# Patient Record
Sex: Female | Born: 1945 | Race: Black or African American | Hispanic: No | Marital: Single | Smoking: Never smoker
Health system: Southern US, Community
[De-identification: ages and names within clinical notes are randomized; demographics above are authoritative.]

## PROBLEM LIST (undated history)

## (undated) DIAGNOSIS — E119 Type 2 diabetes mellitus without complications: Secondary | ICD-10-CM

## (undated) DIAGNOSIS — I10 Essential (primary) hypertension: Secondary | ICD-10-CM

## (undated) HISTORY — PX: ORIF ANKLE FRACTURE: SUR919

---

## 2002-06-27 ENCOUNTER — Ambulatory Visit (HOSPITAL_COMMUNITY): Admission: RE | Admit: 2002-06-27 | Discharge: 2002-06-27 | Payer: Self-pay | Admitting: Family Medicine

## 2002-06-27 ENCOUNTER — Encounter: Payer: Self-pay | Admitting: Family Medicine

## 2002-07-06 ENCOUNTER — Ambulatory Visit (HOSPITAL_COMMUNITY): Admission: RE | Admit: 2002-07-06 | Discharge: 2002-07-06 | Payer: Self-pay | Admitting: Family Medicine

## 2002-07-06 ENCOUNTER — Encounter: Payer: Self-pay | Admitting: Family Medicine

## 2003-06-26 ENCOUNTER — Ambulatory Visit (HOSPITAL_COMMUNITY): Admission: RE | Admit: 2003-06-26 | Discharge: 2003-06-26 | Payer: Self-pay | Admitting: Family Medicine

## 2004-09-09 ENCOUNTER — Ambulatory Visit (HOSPITAL_COMMUNITY): Admission: RE | Admit: 2004-09-09 | Discharge: 2004-09-09 | Payer: Self-pay | Admitting: Family Medicine

## 2006-06-20 ENCOUNTER — Ambulatory Visit (HOSPITAL_COMMUNITY): Admission: RE | Admit: 2006-06-20 | Discharge: 2006-06-20 | Payer: Self-pay | Admitting: Family Medicine

## 2006-07-06 ENCOUNTER — Ambulatory Visit (HOSPITAL_COMMUNITY): Admission: RE | Admit: 2006-07-06 | Discharge: 2006-07-06 | Payer: Self-pay | Admitting: Family Medicine

## 2007-06-27 ENCOUNTER — Ambulatory Visit (HOSPITAL_COMMUNITY): Admission: RE | Admit: 2007-06-27 | Discharge: 2007-06-27 | Payer: Self-pay | Admitting: Family Medicine

## 2008-06-27 ENCOUNTER — Ambulatory Visit (HOSPITAL_COMMUNITY): Admission: RE | Admit: 2008-06-27 | Discharge: 2008-06-27 | Payer: Self-pay | Admitting: Family Medicine

## 2009-06-30 ENCOUNTER — Ambulatory Visit (HOSPITAL_COMMUNITY): Admission: RE | Admit: 2009-06-30 | Discharge: 2009-06-30 | Payer: Self-pay | Admitting: Family Medicine

## 2010-07-14 ENCOUNTER — Ambulatory Visit (HOSPITAL_COMMUNITY): Admission: RE | Admit: 2010-07-14 | Discharge: 2010-07-14 | Payer: Self-pay | Admitting: Internal Medicine

## 2011-07-15 ENCOUNTER — Other Ambulatory Visit (HOSPITAL_COMMUNITY): Payer: Self-pay | Admitting: Internal Medicine

## 2011-07-15 DIAGNOSIS — Z139 Encounter for screening, unspecified: Secondary | ICD-10-CM

## 2011-07-22 ENCOUNTER — Ambulatory Visit (HOSPITAL_COMMUNITY)
Admission: RE | Admit: 2011-07-22 | Discharge: 2011-07-22 | Disposition: A | Discharge status: Home / Self Care | Payer: Medicare Other | Source: Ambulatory Visit | Attending: Internal Medicine | Admitting: Internal Medicine

## 2011-07-22 DIAGNOSIS — Z139 Encounter for screening, unspecified: Secondary | ICD-10-CM

## 2011-07-22 DIAGNOSIS — Z1231 Encounter for screening mammogram for malignant neoplasm of breast: Secondary | ICD-10-CM | POA: Insufficient documentation

## 2011-07-29 ENCOUNTER — Other Ambulatory Visit: Payer: Self-pay | Admitting: Internal Medicine

## 2011-07-29 DIAGNOSIS — R928 Other abnormal and inconclusive findings on diagnostic imaging of breast: Secondary | ICD-10-CM

## 2011-08-18 ENCOUNTER — Ambulatory Visit (HOSPITAL_COMMUNITY): Payer: Medicare Other

## 2011-09-16 DIAGNOSIS — E782 Mixed hyperlipidemia: Secondary | ICD-10-CM | POA: Diagnosis not present

## 2011-09-16 DIAGNOSIS — I10 Essential (primary) hypertension: Secondary | ICD-10-CM | POA: Diagnosis not present

## 2011-09-16 DIAGNOSIS — E119 Type 2 diabetes mellitus without complications: Secondary | ICD-10-CM | POA: Diagnosis not present

## 2011-09-22 ENCOUNTER — Ambulatory Visit (HOSPITAL_COMMUNITY)
Admission: RE | Admit: 2011-09-22 | Discharge: 2011-09-22 | Disposition: A | Discharge status: Home / Self Care | Payer: Medicare Other | Source: Ambulatory Visit | Attending: Internal Medicine | Admitting: Internal Medicine

## 2011-09-22 ENCOUNTER — Other Ambulatory Visit: Payer: Self-pay | Admitting: Internal Medicine

## 2011-09-22 DIAGNOSIS — N6009 Solitary cyst of unspecified breast: Secondary | ICD-10-CM | POA: Diagnosis not present

## 2011-09-22 DIAGNOSIS — N63 Unspecified lump in unspecified breast: Secondary | ICD-10-CM | POA: Diagnosis not present

## 2011-09-22 DIAGNOSIS — R928 Other abnormal and inconclusive findings on diagnostic imaging of breast: Secondary | ICD-10-CM | POA: Insufficient documentation

## 2011-12-17 DIAGNOSIS — E1149 Type 2 diabetes mellitus with other diabetic neurological complication: Secondary | ICD-10-CM | POA: Diagnosis not present

## 2012-03-17 DIAGNOSIS — I10 Essential (primary) hypertension: Secondary | ICD-10-CM | POA: Diagnosis not present

## 2012-03-17 DIAGNOSIS — E782 Mixed hyperlipidemia: Secondary | ICD-10-CM | POA: Diagnosis not present

## 2012-03-17 DIAGNOSIS — E119 Type 2 diabetes mellitus without complications: Secondary | ICD-10-CM | POA: Diagnosis not present

## 2012-06-19 DIAGNOSIS — M549 Dorsalgia, unspecified: Secondary | ICD-10-CM | POA: Diagnosis not present

## 2012-08-15 ENCOUNTER — Other Ambulatory Visit (HOSPITAL_COMMUNITY): Payer: Self-pay | Admitting: Internal Medicine

## 2012-08-15 DIAGNOSIS — Z139 Encounter for screening, unspecified: Secondary | ICD-10-CM

## 2012-09-19 DIAGNOSIS — E119 Type 2 diabetes mellitus without complications: Secondary | ICD-10-CM | POA: Diagnosis not present

## 2012-09-19 DIAGNOSIS — I10 Essential (primary) hypertension: Secondary | ICD-10-CM | POA: Diagnosis not present

## 2012-09-19 DIAGNOSIS — E782 Mixed hyperlipidemia: Secondary | ICD-10-CM | POA: Diagnosis not present

## 2012-09-25 ENCOUNTER — Ambulatory Visit (HOSPITAL_COMMUNITY)
Admission: RE | Admit: 2012-09-25 | Discharge: 2012-09-25 | Disposition: A | Discharge status: Home / Self Care | Payer: Medicare Other | Source: Ambulatory Visit | Attending: Internal Medicine | Admitting: Internal Medicine

## 2012-09-25 DIAGNOSIS — Z139 Encounter for screening, unspecified: Secondary | ICD-10-CM

## 2012-09-25 DIAGNOSIS — Z1231 Encounter for screening mammogram for malignant neoplasm of breast: Secondary | ICD-10-CM | POA: Diagnosis not present

## 2012-12-14 DIAGNOSIS — J301 Allergic rhinitis due to pollen: Secondary | ICD-10-CM | POA: Diagnosis not present

## 2013-03-22 DIAGNOSIS — E119 Type 2 diabetes mellitus without complications: Secondary | ICD-10-CM | POA: Diagnosis not present

## 2013-03-22 DIAGNOSIS — I10 Essential (primary) hypertension: Secondary | ICD-10-CM | POA: Diagnosis not present

## 2013-06-22 DIAGNOSIS — I10 Essential (primary) hypertension: Secondary | ICD-10-CM | POA: Diagnosis not present

## 2013-09-17 ENCOUNTER — Other Ambulatory Visit (HOSPITAL_COMMUNITY): Payer: Self-pay | Admitting: Internal Medicine

## 2013-09-17 DIAGNOSIS — Z139 Encounter for screening, unspecified: Secondary | ICD-10-CM

## 2013-09-24 DIAGNOSIS — Z0000 Encounter for general adult medical examination without abnormal findings: Secondary | ICD-10-CM | POA: Diagnosis not present

## 2013-09-24 DIAGNOSIS — I10 Essential (primary) hypertension: Secondary | ICD-10-CM | POA: Diagnosis not present

## 2013-09-24 DIAGNOSIS — E119 Type 2 diabetes mellitus without complications: Secondary | ICD-10-CM | POA: Diagnosis not present

## 2013-09-27 ENCOUNTER — Ambulatory Visit (HOSPITAL_COMMUNITY)
Admission: RE | Admit: 2013-09-27 | Discharge: 2013-09-27 | Disposition: A | Discharge status: Home / Self Care | Payer: Medicare Other | Source: Ambulatory Visit | Attending: Internal Medicine | Admitting: Internal Medicine

## 2013-09-27 DIAGNOSIS — Z1231 Encounter for screening mammogram for malignant neoplasm of breast: Secondary | ICD-10-CM | POA: Insufficient documentation

## 2013-09-27 DIAGNOSIS — Z139 Encounter for screening, unspecified: Secondary | ICD-10-CM

## 2013-10-01 ENCOUNTER — Other Ambulatory Visit: Payer: Self-pay | Admitting: Internal Medicine

## 2013-10-03 ENCOUNTER — Other Ambulatory Visit (HOSPITAL_COMMUNITY): Payer: Self-pay | Admitting: Internal Medicine

## 2013-10-03 DIAGNOSIS — N63 Unspecified lump in unspecified breast: Secondary | ICD-10-CM

## 2013-10-10 ENCOUNTER — Other Ambulatory Visit (HOSPITAL_COMMUNITY): Payer: Self-pay | Admitting: Internal Medicine

## 2013-10-10 ENCOUNTER — Other Ambulatory Visit (HOSPITAL_COMMUNITY): Payer: Medicare Other

## 2013-10-10 DIAGNOSIS — N63 Unspecified lump in unspecified breast: Secondary | ICD-10-CM

## 2013-10-17 ENCOUNTER — Ambulatory Visit (HOSPITAL_COMMUNITY)
Admission: RE | Admit: 2013-10-17 | Discharge: 2013-10-17 | Disposition: A | Discharge status: Home / Self Care | Payer: Medicare Other | Source: Ambulatory Visit | Attending: Internal Medicine | Admitting: Internal Medicine

## 2013-10-17 ENCOUNTER — Ambulatory Visit (HOSPITAL_COMMUNITY): Payer: Medicare Other

## 2013-10-17 ENCOUNTER — Other Ambulatory Visit (HOSPITAL_COMMUNITY): Payer: Self-pay | Admitting: Internal Medicine

## 2013-10-17 DIAGNOSIS — N63 Unspecified lump in unspecified breast: Secondary | ICD-10-CM

## 2013-10-17 DIAGNOSIS — R928 Other abnormal and inconclusive findings on diagnostic imaging of breast: Secondary | ICD-10-CM | POA: Diagnosis not present

## 2013-10-17 DIAGNOSIS — N6009 Solitary cyst of unspecified breast: Secondary | ICD-10-CM | POA: Diagnosis not present

## 2013-10-17 DIAGNOSIS — R922 Inconclusive mammogram: Secondary | ICD-10-CM | POA: Diagnosis not present

## 2013-12-27 DIAGNOSIS — I10 Essential (primary) hypertension: Secondary | ICD-10-CM | POA: Diagnosis not present

## 2013-12-27 DIAGNOSIS — IMO0001 Reserved for inherently not codable concepts without codable children: Secondary | ICD-10-CM | POA: Diagnosis not present

## 2014-03-28 DIAGNOSIS — I10 Essential (primary) hypertension: Secondary | ICD-10-CM | POA: Diagnosis not present

## 2014-03-28 DIAGNOSIS — IMO0001 Reserved for inherently not codable concepts without codable children: Secondary | ICD-10-CM | POA: Diagnosis not present

## 2014-04-08 DIAGNOSIS — Z1211 Encounter for screening for malignant neoplasm of colon: Secondary | ICD-10-CM | POA: Diagnosis not present

## 2014-06-27 DIAGNOSIS — E1065 Type 1 diabetes mellitus with hyperglycemia: Secondary | ICD-10-CM | POA: Diagnosis not present

## 2014-06-27 DIAGNOSIS — I10 Essential (primary) hypertension: Secondary | ICD-10-CM | POA: Diagnosis not present

## 2014-07-23 DIAGNOSIS — E119 Type 2 diabetes mellitus without complications: Secondary | ICD-10-CM | POA: Diagnosis not present

## 2014-09-23 ENCOUNTER — Other Ambulatory Visit (HOSPITAL_COMMUNITY): Payer: Self-pay | Admitting: Internal Medicine

## 2014-09-23 DIAGNOSIS — Z1231 Encounter for screening mammogram for malignant neoplasm of breast: Secondary | ICD-10-CM

## 2014-10-01 DIAGNOSIS — Z0000 Encounter for general adult medical examination without abnormal findings: Secondary | ICD-10-CM | POA: Diagnosis not present

## 2014-10-01 DIAGNOSIS — I10 Essential (primary) hypertension: Secondary | ICD-10-CM | POA: Diagnosis not present

## 2014-10-01 DIAGNOSIS — Z1389 Encounter for screening for other disorder: Secondary | ICD-10-CM | POA: Diagnosis not present

## 2014-10-21 ENCOUNTER — Ambulatory Visit (HOSPITAL_COMMUNITY)
Admission: RE | Admit: 2014-10-21 | Discharge: 2014-10-21 | Disposition: A | Discharge status: Home / Self Care | Payer: Medicare Other | Source: Ambulatory Visit | Attending: Internal Medicine | Admitting: Internal Medicine

## 2014-10-21 DIAGNOSIS — Z1231 Encounter for screening mammogram for malignant neoplasm of breast: Secondary | ICD-10-CM | POA: Insufficient documentation

## 2015-01-02 DIAGNOSIS — I10 Essential (primary) hypertension: Secondary | ICD-10-CM | POA: Diagnosis not present

## 2015-01-02 DIAGNOSIS — Z79899 Other long term (current) drug therapy: Secondary | ICD-10-CM | POA: Diagnosis not present

## 2015-01-02 DIAGNOSIS — E119 Type 2 diabetes mellitus without complications: Secondary | ICD-10-CM | POA: Diagnosis not present

## 2015-01-02 DIAGNOSIS — E780 Pure hypercholesterolemia: Secondary | ICD-10-CM | POA: Diagnosis not present

## 2015-01-02 DIAGNOSIS — Z1211 Encounter for screening for malignant neoplasm of colon: Secondary | ICD-10-CM | POA: Diagnosis not present

## 2015-01-02 DIAGNOSIS — E785 Hyperlipidemia, unspecified: Secondary | ICD-10-CM | POA: Diagnosis not present

## 2015-01-08 DIAGNOSIS — E1165 Type 2 diabetes mellitus with hyperglycemia: Secondary | ICD-10-CM | POA: Diagnosis not present

## 2015-01-08 DIAGNOSIS — I10 Essential (primary) hypertension: Secondary | ICD-10-CM | POA: Diagnosis not present

## 2015-01-08 DIAGNOSIS — J309 Allergic rhinitis, unspecified: Secondary | ICD-10-CM | POA: Diagnosis not present

## 2015-04-10 DIAGNOSIS — E1165 Type 2 diabetes mellitus with hyperglycemia: Secondary | ICD-10-CM | POA: Diagnosis not present

## 2015-04-10 DIAGNOSIS — I10 Essential (primary) hypertension: Secondary | ICD-10-CM | POA: Diagnosis not present

## 2015-07-14 DIAGNOSIS — E1122 Type 2 diabetes mellitus with diabetic chronic kidney disease: Secondary | ICD-10-CM | POA: Diagnosis not present

## 2015-07-14 DIAGNOSIS — I10 Essential (primary) hypertension: Secondary | ICD-10-CM | POA: Diagnosis not present

## 2015-07-14 DIAGNOSIS — E1165 Type 2 diabetes mellitus with hyperglycemia: Secondary | ICD-10-CM | POA: Diagnosis not present

## 2015-10-01 ENCOUNTER — Other Ambulatory Visit (HOSPITAL_COMMUNITY): Payer: Self-pay | Admitting: Internal Medicine

## 2015-10-01 DIAGNOSIS — Z1231 Encounter for screening mammogram for malignant neoplasm of breast: Secondary | ICD-10-CM

## 2015-10-16 DIAGNOSIS — Z0000 Encounter for general adult medical examination without abnormal findings: Secondary | ICD-10-CM | POA: Diagnosis not present

## 2015-10-16 DIAGNOSIS — E1121 Type 2 diabetes mellitus with diabetic nephropathy: Secondary | ICD-10-CM | POA: Diagnosis not present

## 2015-10-16 DIAGNOSIS — I10 Essential (primary) hypertension: Secondary | ICD-10-CM | POA: Diagnosis not present

## 2015-10-16 DIAGNOSIS — Z1389 Encounter for screening for other disorder: Secondary | ICD-10-CM | POA: Diagnosis not present

## 2015-10-23 ENCOUNTER — Ambulatory Visit (HOSPITAL_COMMUNITY): Payer: Medicare Other

## 2015-10-27 ENCOUNTER — Ambulatory Visit (HOSPITAL_COMMUNITY)
Admission: RE | Admit: 2015-10-27 | Discharge: 2015-10-27 | Disposition: A | Discharge status: Home / Self Care | Payer: Medicare Other | Source: Ambulatory Visit | Attending: Internal Medicine | Admitting: Internal Medicine

## 2015-10-27 DIAGNOSIS — Z1231 Encounter for screening mammogram for malignant neoplasm of breast: Secondary | ICD-10-CM | POA: Insufficient documentation

## 2015-10-27 DIAGNOSIS — R928 Other abnormal and inconclusive findings on diagnostic imaging of breast: Secondary | ICD-10-CM | POA: Insufficient documentation

## 2015-10-30 ENCOUNTER — Other Ambulatory Visit: Payer: Self-pay | Admitting: Nurse Practitioner

## 2015-10-30 ENCOUNTER — Other Ambulatory Visit: Payer: Self-pay | Admitting: Internal Medicine

## 2015-10-30 DIAGNOSIS — R928 Other abnormal and inconclusive findings on diagnostic imaging of breast: Secondary | ICD-10-CM

## 2015-11-05 ENCOUNTER — Other Ambulatory Visit: Payer: Self-pay | Admitting: Internal Medicine

## 2015-11-05 DIAGNOSIS — R928 Other abnormal and inconclusive findings on diagnostic imaging of breast: Secondary | ICD-10-CM

## 2015-11-11 ENCOUNTER — Ambulatory Visit (HOSPITAL_COMMUNITY)
Admission: RE | Admit: 2015-11-11 | Discharge: 2015-11-11 | Disposition: A | Discharge status: Home / Self Care | Payer: Medicare Other | Source: Ambulatory Visit | Attending: Internal Medicine | Admitting: Internal Medicine

## 2015-11-11 DIAGNOSIS — R928 Other abnormal and inconclusive findings on diagnostic imaging of breast: Secondary | ICD-10-CM | POA: Diagnosis not present

## 2015-11-11 DIAGNOSIS — N63 Unspecified lump in breast: Secondary | ICD-10-CM | POA: Diagnosis not present

## 2015-11-11 DIAGNOSIS — N6001 Solitary cyst of right breast: Secondary | ICD-10-CM | POA: Insufficient documentation

## 2015-11-18 ENCOUNTER — Encounter (HOSPITAL_COMMUNITY): Payer: Medicare Other

## 2016-01-05 DIAGNOSIS — E11319 Type 2 diabetes mellitus with unspecified diabetic retinopathy without macular edema: Secondary | ICD-10-CM | POA: Diagnosis not present

## 2016-01-15 DIAGNOSIS — E1121 Type 2 diabetes mellitus with diabetic nephropathy: Secondary | ICD-10-CM | POA: Diagnosis not present

## 2016-01-15 DIAGNOSIS — I10 Essential (primary) hypertension: Secondary | ICD-10-CM | POA: Diagnosis not present

## 2016-04-23 DIAGNOSIS — E784 Other hyperlipidemia: Secondary | ICD-10-CM | POA: Diagnosis not present

## 2016-04-23 DIAGNOSIS — E1121 Type 2 diabetes mellitus with diabetic nephropathy: Secondary | ICD-10-CM | POA: Diagnosis not present

## 2016-04-23 DIAGNOSIS — I10 Essential (primary) hypertension: Secondary | ICD-10-CM | POA: Diagnosis not present

## 2016-07-29 DIAGNOSIS — E784 Other hyperlipidemia: Secondary | ICD-10-CM | POA: Diagnosis not present

## 2016-07-29 DIAGNOSIS — E1121 Type 2 diabetes mellitus with diabetic nephropathy: Secondary | ICD-10-CM | POA: Diagnosis not present

## 2016-07-29 DIAGNOSIS — I10 Essential (primary) hypertension: Secondary | ICD-10-CM | POA: Diagnosis not present

## 2016-10-25 ENCOUNTER — Other Ambulatory Visit (HOSPITAL_COMMUNITY): Payer: Self-pay | Admitting: Internal Medicine

## 2016-10-25 DIAGNOSIS — Z1231 Encounter for screening mammogram for malignant neoplasm of breast: Secondary | ICD-10-CM

## 2016-11-01 DIAGNOSIS — Z0000 Encounter for general adult medical examination without abnormal findings: Secondary | ICD-10-CM | POA: Diagnosis not present

## 2016-11-01 DIAGNOSIS — I10 Essential (primary) hypertension: Secondary | ICD-10-CM | POA: Diagnosis not present

## 2016-11-01 DIAGNOSIS — E784 Other hyperlipidemia: Secondary | ICD-10-CM | POA: Diagnosis not present

## 2016-11-01 DIAGNOSIS — E1121 Type 2 diabetes mellitus with diabetic nephropathy: Secondary | ICD-10-CM | POA: Diagnosis not present

## 2016-11-01 DIAGNOSIS — Z1389 Encounter for screening for other disorder: Secondary | ICD-10-CM | POA: Diagnosis not present

## 2016-11-11 ENCOUNTER — Ambulatory Visit (HOSPITAL_COMMUNITY)
Admission: RE | Admit: 2016-11-11 | Discharge: 2016-11-11 | Disposition: A | Discharge status: Home / Self Care | Payer: Medicare Other | Source: Ambulatory Visit | Attending: Internal Medicine | Admitting: Internal Medicine

## 2016-11-11 DIAGNOSIS — Z1231 Encounter for screening mammogram for malignant neoplasm of breast: Secondary | ICD-10-CM | POA: Insufficient documentation

## 2016-11-15 DIAGNOSIS — Z23 Encounter for immunization: Secondary | ICD-10-CM | POA: Diagnosis not present

## 2017-01-31 DIAGNOSIS — I10 Essential (primary) hypertension: Secondary | ICD-10-CM | POA: Diagnosis not present

## 2017-01-31 DIAGNOSIS — E784 Other hyperlipidemia: Secondary | ICD-10-CM | POA: Diagnosis not present

## 2017-01-31 DIAGNOSIS — E1121 Type 2 diabetes mellitus with diabetic nephropathy: Secondary | ICD-10-CM | POA: Diagnosis not present

## 2017-03-14 DIAGNOSIS — E11319 Type 2 diabetes mellitus with unspecified diabetic retinopathy without macular edema: Secondary | ICD-10-CM | POA: Diagnosis not present

## 2017-05-09 DIAGNOSIS — E784 Other hyperlipidemia: Secondary | ICD-10-CM | POA: Diagnosis not present

## 2017-05-09 DIAGNOSIS — E1121 Type 2 diabetes mellitus with diabetic nephropathy: Secondary | ICD-10-CM | POA: Diagnosis not present

## 2017-05-09 DIAGNOSIS — I10 Essential (primary) hypertension: Secondary | ICD-10-CM | POA: Diagnosis not present

## 2017-07-03 ENCOUNTER — Emergency Department (HOSPITAL_COMMUNITY)
Admission: EM | Admit: 2017-07-03 | Discharge: 2017-07-03 | Disposition: A | Discharge status: Home / Self Care | Payer: Medicare Other | Attending: Emergency Medicine | Admitting: Emergency Medicine

## 2017-07-03 ENCOUNTER — Encounter (HOSPITAL_COMMUNITY): Payer: Self-pay | Admitting: Emergency Medicine

## 2017-07-03 DIAGNOSIS — E119 Type 2 diabetes mellitus without complications: Secondary | ICD-10-CM | POA: Insufficient documentation

## 2017-07-03 DIAGNOSIS — R410 Disorientation, unspecified: Secondary | ICD-10-CM | POA: Diagnosis not present

## 2017-07-03 DIAGNOSIS — R4182 Altered mental status, unspecified: Secondary | ICD-10-CM | POA: Diagnosis present

## 2017-07-03 DIAGNOSIS — I10 Essential (primary) hypertension: Secondary | ICD-10-CM | POA: Diagnosis not present

## 2017-07-03 HISTORY — DX: Type 2 diabetes mellitus without complications: E11.9

## 2017-07-03 HISTORY — DX: Essential (primary) hypertension: I10

## 2017-07-03 LAB — CBG MONITORING, ED: GLUCOSE-CAPILLARY: 101 mg/dL — AB (ref 65–99)

## 2017-07-03 NOTE — ED Provider Notes (Signed)
[facility] Provider Note   CSN: [id number] Arrival date & time: 07/03/17  [id number]     History   Chief Complaint Chief Complaint  Patient presents with  . Altered Mental Status    HPI Jennifer Short is a 71 y.o. female.  HPI Patient is a 71 year old female who awoke at around 6 AM and had difficulty remembering her daughter's phone number.  This was transient and lasted about 10 or 15 minutes.  The patient's husband reports no difficulty with her speech or slurred speech.  No word finding difficulty either just could not remember the phone number.  Patient denies weakness or numbness of her arms or legs.  No facial asymmetry noted by the husband.  No headaches.  No urinary symptoms.  No chest pain or shortness of breath.  Asymptomatic at this time.  The husband wanted her to come to the ER to be evaluated.  No prior history of stroke.  She does have a history of diabetes and hypertension.  She is completely asymptomatic at this time.     Past Medical History:  Diagnosis Date  . Diabetes mellitus without complication (HCC)   . Hypertension     There are no active problems to display for this patient.   History reviewed. No pertinent surgical history.  OB History    Gravida Para Term Preterm AB Living   4 4 2 2   4    SAB TAB Ectopic Multiple Live Births                   Home Medications    Prior to Admission medications   Not on File    Family History Family History  Problem Relation Age of Onset  . Diabetes Father     Social History Social History  Substance Use Topics  . Smoking status: Never Smoker  . Smokeless tobacco: Never Used  . Alcohol use Yes     Comment: occasional     Allergies   Patient has no known allergies.   Review of Systems Review of Systems  All other systems reviewed and are negative.    Physical Exam Updated Vital Signs BP (!) 178/80 (BP Location: Right Arm)   Pulse 67   Temp 97.6 F (36.4 C)  (Oral)   Resp 18   Ht 5\' 2"  (1.575 m)   Wt 62.6 kg (138 lb)   SpO2 100%   BMI 25.24 kg/m   Physical Exam  Constitutional: She is oriented to person, place, and time. She appears well-developed and well-nourished. No distress.  HENT:  Head: Normocephalic and atraumatic.  Eyes: Pupils are equal, round, and reactive to light. EOM are normal.  Neck: Normal range of motion.  Cardiovascular: Normal rate and regular rhythm.   Pulmonary/Chest: Effort normal and breath sounds normal.  Abdominal: Soft. She exhibits no distension. There is no tenderness.  Musculoskeletal: Normal range of motion.  Neurological: She is alert and oriented to person, place, and time.  5/5 strength in major muscle groups of  bilateral upper and lower extremities. Speech normal. No facial asymetry.   Skin: Skin is warm and dry.  Psychiatric: She has a normal mood and affect. Judgment normal.  Nursing note and vitals reviewed.    ED Treatments / Results  Labs (all labs ordered are listed, but only abnormal results are displayed) Labs Reviewed  CBG MONITORING, ED - Abnormal; Notable for the following:       Result Value   Glucose-Capillary  101 (*)    All other components within normal limits    EKG  EKG Interpretation  Date/Time:  Sunday July 03 2017 07:37:58 EDT Ventricular Rate:  66 PR Interval:    QRS Duration: 103 QT Interval:  442 QTC Calculation: 464 R Axis:   29 Text Interpretation:  Sinus rhythm Abnormal R-wave progression, early transition Borderline T wave abnormalities No old tracing to compare Confirmed by Azalia Bilis ([id number]) on 07/03/2017 8:00:54 AM       Radiology No results found.  Procedures Procedures (including critical care time)  Medications Ordered in ED Medications - No data to display   Initial Impression / Assessment and Plan / ED Course  I have reviewed the triage vital signs and the nursing notes.  Pertinent labs & imaging results that were available during  my care of the patient were reviewed by me and considered in my medical decision making (see chart for details).     Blood sugar normal. ecg normal. Normal neuro exam. Doubt stroke. No HA. Close pcp follow up  Final Clinical Impressions(s) / ED Diagnoses   Final diagnoses:  Transient confusion    New Prescriptions New Prescriptions   No medications on file     Azalia Bilis, MD 07/03/17 [phone]

## 2017-07-03 NOTE — ED Triage Notes (Addendum)
Patient and boyfriend report approx 20 minutes of altered mental status this morning after waking. Per patient "clear headed" now. Patient states "I was thinking about something but I can't think of what it was." Denies any headache, slurred speech, visual changes, or weakness. Patient alert and oriented at this time.

## 2017-07-05 DIAGNOSIS — R4182 Altered mental status, unspecified: Secondary | ICD-10-CM | POA: Diagnosis not present

## 2017-07-05 DIAGNOSIS — I10 Essential (primary) hypertension: Secondary | ICD-10-CM | POA: Diagnosis not present

## 2017-08-12 DIAGNOSIS — R4182 Altered mental status, unspecified: Secondary | ICD-10-CM | POA: Diagnosis not present

## 2017-08-12 DIAGNOSIS — I10 Essential (primary) hypertension: Secondary | ICD-10-CM | POA: Diagnosis not present

## 2017-08-12 DIAGNOSIS — E119 Type 2 diabetes mellitus without complications: Secondary | ICD-10-CM | POA: Diagnosis not present

## 2017-08-12 DIAGNOSIS — E0865 Diabetes mellitus due to underlying condition with hyperglycemia: Secondary | ICD-10-CM | POA: Diagnosis not present

## 2017-08-12 DIAGNOSIS — E7849 Other hyperlipidemia: Secondary | ICD-10-CM | POA: Diagnosis not present

## 2017-10-07 ENCOUNTER — Emergency Department (HOSPITAL_COMMUNITY)
Admission: EM | Admit: 2017-10-07 | Discharge: 2017-10-07 | Disposition: A | Discharge status: Home / Self Care | Payer: Medicare Other | Attending: Emergency Medicine | Admitting: Emergency Medicine

## 2017-10-07 ENCOUNTER — Encounter (HOSPITAL_COMMUNITY): Payer: Self-pay

## 2017-10-07 ENCOUNTER — Other Ambulatory Visit: Payer: Self-pay

## 2017-10-07 DIAGNOSIS — R05 Cough: Secondary | ICD-10-CM | POA: Diagnosis present

## 2017-10-07 DIAGNOSIS — I10 Essential (primary) hypertension: Secondary | ICD-10-CM | POA: Diagnosis not present

## 2017-10-07 DIAGNOSIS — E162 Hypoglycemia, unspecified: Secondary | ICD-10-CM

## 2017-10-07 DIAGNOSIS — E11649 Type 2 diabetes mellitus with hypoglycemia without coma: Secondary | ICD-10-CM | POA: Diagnosis not present

## 2017-10-07 DIAGNOSIS — J209 Acute bronchitis, unspecified: Secondary | ICD-10-CM | POA: Insufficient documentation

## 2017-10-07 LAB — CBG MONITORING, ED
GLUCOSE-CAPILLARY: 181 mg/dL — AB (ref 65–99)
Glucose-Capillary: 36 mg/dL — CL (ref 65–99)

## 2017-10-07 MED ORDER — AZITHROMYCIN 250 MG PO TABS
500.0000 mg | ORAL_TABLET | Freq: Once | ORAL | Status: AC
  Administered 2017-10-07: 500 mg via ORAL
  Filled 2017-10-07: qty 2

## 2017-10-07 MED ORDER — AZITHROMYCIN 250 MG PO TABS: ORAL_TABLET | ORAL | 0 refills | Status: DC

## 2017-10-07 MED ORDER — DEXTROSE 50 % IV SOLN
INTRAVENOUS | Status: AC
  Administered 2017-10-07: 50 mL
  Filled 2017-10-07: qty 50

## 2017-10-07 NOTE — ED Triage Notes (Signed)
Husband now report that patient has been acting confused tonight, awoke from sleep and "talking out her head"  Pt is clear with speech, answers questions appropriately, denies complaints other than cold symptoms

## 2017-10-07 NOTE — Discharge Instructions (Signed)
Zithromax as prescribed.  Continue over the counter medications as needed for symptom relief.  Follow-up with your primary doctor if not improving in the next week.

## 2017-10-07 NOTE — ED Provider Notes (Signed)
[facility]   CSN: [id number] Arrival date & time: 10/07/17  0243     History   Chief Complaint Chief Complaint  Patient presents with  . Hypoglycemia    congestion    HPI Jennifer Short is a 72 y.o. female.  Patient is a 72 year old female with past medical history of diabetes and hypertension.  She presents today for evaluation of chest congestion and productive cough worsening over the past week.  She has tried over-the-counter medications with little relief.  She denies any fevers or chills.  She denies any chest pain.   The history is provided by the patient.  URI   This is a new problem. Episode onset: 1 week ago. The problem has been gradually worsening. There has been no fever. Pertinent negatives include no chest pain.    Past Medical History:  Diagnosis Date  . Diabetes mellitus without complication (HCC)   . Hypertension     There are no active problems to display for this patient.   History reviewed. No pertinent surgical history.  OB History    Gravida Para Term Preterm AB Living   4 4 2 2   4    SAB TAB Ectopic Multiple Live Births                   Home Medications    Prior to Admission medications   Not on File    Family History Family History  Problem Relation Age of Onset  . Diabetes Father     Social History Social History   Tobacco Use  . Smoking status: Never Smoker  . Smokeless tobacco: Never Used  Substance Use Topics  . Alcohol use: Yes    Comment: occasional  . Drug use: No     Allergies   Patient has no known allergies.   Review of Systems Review of Systems  Cardiovascular: Negative for chest pain.  All other systems reviewed and are negative.    Physical Exam Updated Vital Signs BP (!) 162/48 (BP Location: Right Arm)   Pulse 66   Temp 98 F (36.7 C) (Oral)   Resp 18   Ht 5\' 2"  (1.575 m)   Wt 59 kg (130 lb)   SpO2 100%   BMI 23.78 kg/m   Physical Exam    Constitutional: She is oriented to person, place, and time. She appears well-developed and well-nourished. No distress.  HENT:  Head: Normocephalic and atraumatic.  Neck: Normal range of motion. Neck supple.  Cardiovascular: Normal rate and regular rhythm. Exam reveals no gallop and no friction rub.  No murmur heard. Pulmonary/Chest: Effort normal and breath sounds normal. No respiratory distress. She has no wheezes.  Abdominal: Soft. Bowel sounds are normal. She exhibits no distension. There is no tenderness.  Musculoskeletal: Normal range of motion.  Neurological: She is alert and oriented to person, place, and time.  Skin: Skin is warm and dry. She is not diaphoretic.  Nursing note and vitals reviewed.    ED Treatments / Results  Labs (all labs ordered are listed, but only abnormal results are displayed) Labs Reviewed  CBG MONITORING, ED - Abnormal; Notable for the following components:      Result Value   Glucose-Capillary 36 (*)    All other components within normal limits  CBG MONITORING, ED - Abnormal; Notable for the following components:   Glucose-Capillary 181 (*)    All other components within normal limits    EKG  EKG  Interpretation None       Radiology No results found.  Procedures Procedures (including critical care time)  Medications Ordered in ED Medications  dextrose 50 % solution (50 mLs  Given 10/07/17 0323)     Initial Impression / Assessment and Plan / ED Course  I have reviewed the triage vital signs and the nursing notes.  Pertinent labs & imaging results that were available during my care of the patient were reviewed by me and considered in my medical decision making (see chart for details).  Patient symptoms are most likely related to an acute bronchitis.  While she was here she developed hypoglycemia with blood sugar to the 30s.  She was then given D50 and her sugar resolved.  She will be treated with Zithromax, continued over-the-counter  medications, and follow-up as needed.  Final Clinical Impressions(s) / ED Diagnoses   Final diagnoses:  None    ED Discharge Orders    None       Geoffery Lyons, MD 10/07/17 [phone]

## 2017-10-07 NOTE — ED Triage Notes (Signed)
Pt reports cough and congestion for several days.  Pt denies fever, body aches. Pt states she is taking otc meds for same.  Pt states her husband felt she needed to come to be checked

## 2017-11-16 DIAGNOSIS — E119 Type 2 diabetes mellitus without complications: Secondary | ICD-10-CM | POA: Diagnosis not present

## 2017-11-16 DIAGNOSIS — E7849 Other hyperlipidemia: Secondary | ICD-10-CM | POA: Diagnosis not present

## 2017-11-16 DIAGNOSIS — I10 Essential (primary) hypertension: Secondary | ICD-10-CM | POA: Diagnosis not present

## 2017-12-20 ENCOUNTER — Other Ambulatory Visit (HOSPITAL_COMMUNITY): Payer: Self-pay | Admitting: Internal Medicine

## 2017-12-20 DIAGNOSIS — Z1231 Encounter for screening mammogram for malignant neoplasm of breast: Secondary | ICD-10-CM

## 2017-12-26 ENCOUNTER — Ambulatory Visit (HOSPITAL_COMMUNITY)
Admission: RE | Admit: 2017-12-26 | Discharge: 2017-12-26 | Disposition: A | Discharge status: Home / Self Care | Payer: Medicare Other | Source: Ambulatory Visit | Attending: Internal Medicine | Admitting: Internal Medicine

## 2017-12-26 DIAGNOSIS — Z1231 Encounter for screening mammogram for malignant neoplasm of breast: Secondary | ICD-10-CM | POA: Insufficient documentation

## 2018-02-20 DIAGNOSIS — I10 Essential (primary) hypertension: Secondary | ICD-10-CM | POA: Diagnosis not present

## 2018-02-20 DIAGNOSIS — E119 Type 2 diabetes mellitus without complications: Secondary | ICD-10-CM | POA: Diagnosis not present

## 2018-02-20 DIAGNOSIS — E7849 Other hyperlipidemia: Secondary | ICD-10-CM | POA: Diagnosis not present

## 2018-03-30 DIAGNOSIS — H04122 Dry eye syndrome of left lacrimal gland: Secondary | ICD-10-CM | POA: Diagnosis not present

## 2018-03-30 DIAGNOSIS — H5213 Myopia, bilateral: Secondary | ICD-10-CM | POA: Diagnosis not present

## 2018-06-01 DIAGNOSIS — E7849 Other hyperlipidemia: Secondary | ICD-10-CM | POA: Diagnosis not present

## 2018-06-01 DIAGNOSIS — E119 Type 2 diabetes mellitus without complications: Secondary | ICD-10-CM | POA: Diagnosis not present

## 2018-06-01 DIAGNOSIS — I10 Essential (primary) hypertension: Secondary | ICD-10-CM | POA: Diagnosis not present

## 2018-06-01 DIAGNOSIS — Z1389 Encounter for screening for other disorder: Secondary | ICD-10-CM | POA: Diagnosis not present

## 2018-06-01 DIAGNOSIS — Z0000 Encounter for general adult medical examination without abnormal findings: Secondary | ICD-10-CM | POA: Diagnosis not present

## 2018-06-22 DIAGNOSIS — H25811 Combined forms of age-related cataract, right eye: Secondary | ICD-10-CM | POA: Diagnosis not present

## 2018-06-22 DIAGNOSIS — H25813 Combined forms of age-related cataract, bilateral: Secondary | ICD-10-CM | POA: Diagnosis not present

## 2018-06-23 NOTE — Patient Instructions (Signed)
Your procedure is scheduled on: 06/30/2018  Report to [facility] at   745    AM.  Call this number if you have problems the morning of surgery: [phone]   Do not eat food or drink liquids :After Midnight.      Take these medicines the morning of surgery with A SIP OF WATER: amlodipine, benazepril.   Do not wear jewelry, make-up or nail polish.  Do not wear lotions, powders, or perfumes. You may wear deodorant.  Do not shave 48 hours prior to surgery.  Do not bring valuables to the hospital.  Contacts, dentures or bridgework may not be worn into surgery.  Leave suitcase in the car. After surgery it may be brought to your room.  For patients admitted to the hospital, checkout time is 11:00 AM the day of discharge.   Patients discharged the day of surgery will not be allowed to drive home.  :     Please read over the following fact sheets that you were given: Coughing and Deep Breathing, Surgical Site Infection Prevention, Anesthesia Post-op Instructions and Care and Recovery After Surgery    Cataract A cataract is a clouding of the lens of the eye. When a lens becomes cloudy, vision is reduced based on the degree and nature of the clouding. Many cataracts reduce vision to some degree. Some cataracts make people more near-sighted as they develop. Other cataracts increase glare. Cataracts that are ignored and become worse can sometimes look white. The white color can be seen through the pupil. CAUSES   Aging. However, cataracts may occur at any age, even in newborns.   Certain drugs.   Trauma to the eye.   Certain diseases such as diabetes.   Specific eye diseases such as chronic inflammation inside the eye or a sudden attack of a rare form of glaucoma.   Inherited or acquired medical problems.  SYMPTOMS   Gradual, progressive drop in vision in the affected eye.   Severe, rapid visual loss. This most often happens when trauma is the cause.  DIAGNOSIS  To detect a cataract, an  eye doctor examines the lens. Cataracts are best diagnosed with an exam of the eyes with the pupils enlarged (dilated) by drops.  TREATMENT  For an early cataract, vision may improve by using different eyeglasses or stronger lighting. If that does not help your vision, surgery is the only effective treatment. A cataract needs to be surgically removed when vision loss interferes with your everyday activities, such as driving, reading, or watching TV. A cataract may also have to be removed if it prevents examination or treatment of another eye problem. Surgery removes the cloudy lens and usually replaces it with a substitute lens (intraocular lens, IOL).  At a time when both you and your doctor agree, the cataract will be surgically removed. If you have cataracts in both eyes, only one is usually removed at a time. This allows the operated eye to heal and be out of danger from any possible problems after surgery (such as infection or poor wound healing). In rare cases, a cataract may be doing damage to your eye. In these cases, your caregiver may advise surgical removal right away. The vast majority of people who have cataract surgery have better vision afterward. HOME CARE INSTRUCTIONS  If you are not planning surgery, you may be asked to do the following:  Use different eyeglasses.   Use stronger or brighter lighting.   Ask your eye doctor about reducing  your medicine dose or changing medicines if it is thought that a medicine caused your cataract. Changing medicines does not make the cataract go away on its own.   Become familiar with your surroundings. Poor vision can lead to injury. Avoid bumping into things on the affected side. You are at a higher risk for tripping or falling.   Exercise extreme care when driving or operating machinery.   Wear sunglasses if you are sensitive to bright light or experiencing problems with glare.  SEEK IMMEDIATE MEDICAL CARE IF:   You have a worsening or sudden  vision loss.   You notice redness, swelling, or increasing pain in the eye.   You have a fever.  Document Released: 08/30/2005 Document Revised: 08/19/2011 Document Reviewed: 04/23/2011 [facility] Patient Information 2012 [place].PATIENT INSTRUCTIONS POST-ANESTHESIA  IMMEDIATELY FOLLOWING SURGERY:  Do not drive or operate machinery for the first twenty four hours after surgery.  Do not make any important decisions for twenty four hours after surgery or while taking narcotic pain medications or sedatives.  If you develop intractable nausea and vomiting or a severe headache please notify your doctor immediately.  FOLLOW-UP:  Please make an appointment with your surgeon as instructed. You do not need to follow up with anesthesia unless specifically instructed to do so.  WOUND CARE INSTRUCTIONS (if applicable):  Keep a dry clean dressing on the anesthesia/puncture wound site if there is drainage.  Once the wound has quit draining you may leave it open to air.  Generally you should leave the bandage intact for twenty four hours unless there is drainage.  If the epidural site drains for more than 36-48 hours please call the anesthesia department.  QUESTIONS?:  Please feel free to call your physician or the hospital operator if you have any questions, and they will be happy to assist you.

## 2018-06-27 ENCOUNTER — Encounter (HOSPITAL_COMMUNITY): Payer: Self-pay

## 2018-06-27 ENCOUNTER — Other Ambulatory Visit: Payer: Self-pay

## 2018-06-27 ENCOUNTER — Encounter (HOSPITAL_COMMUNITY)
Admission: RE | Admit: 2018-06-27 | Discharge: 2018-06-27 | Disposition: A | Discharge status: Home / Self Care | Payer: Medicare Other | Source: Ambulatory Visit | Attending: Ophthalmology | Admitting: Ophthalmology

## 2018-06-27 DIAGNOSIS — Z79899 Other long term (current) drug therapy: Secondary | ICD-10-CM | POA: Diagnosis not present

## 2018-06-27 DIAGNOSIS — E119 Type 2 diabetes mellitus without complications: Secondary | ICD-10-CM | POA: Diagnosis not present

## 2018-06-27 DIAGNOSIS — I10 Essential (primary) hypertension: Secondary | ICD-10-CM | POA: Diagnosis not present

## 2018-06-27 DIAGNOSIS — Z7984 Long term (current) use of oral hypoglycemic drugs: Secondary | ICD-10-CM | POA: Diagnosis not present

## 2018-06-27 DIAGNOSIS — Z01818 Encounter for other preprocedural examination: Secondary | ICD-10-CM

## 2018-06-27 DIAGNOSIS — H25811 Combined forms of age-related cataract, right eye: Secondary | ICD-10-CM | POA: Diagnosis not present

## 2018-06-27 DIAGNOSIS — E7800 Pure hypercholesterolemia, unspecified: Secondary | ICD-10-CM | POA: Diagnosis not present

## 2018-06-27 LAB — BASIC METABOLIC PANEL
Anion gap: 9 (ref 5–15)
BUN: 12 mg/dL (ref 8–23)
CO2: 26 mmol/L (ref 22–32)
CREATININE: 1.01 mg/dL — AB (ref 0.44–1.00)
Calcium: 9.3 mg/dL (ref 8.9–10.3)
Chloride: 102 mmol/L (ref 98–111)
GFR, EST NON AFRICAN AMERICAN: 54 mL/min — AB (ref >60)
Glucose, Bld: 126 mg/dL — ABNORMAL HIGH (ref 70–99)
POTASSIUM: 3 mmol/L — AB (ref 3.5–5.1)
SODIUM: 137 mmol/L (ref 135–145)

## 2018-06-27 LAB — CBC WITH DIFFERENTIAL/PLATELET
ABS IMMATURE GRANULOCYTES: 0.02 10*3/uL (ref 0.00–0.07)
BASOS ABS: 0 10*3/uL (ref 0.0–0.1)
BASOS PCT: 0 %
Eosinophils Absolute: 0.1 10*3/uL (ref 0.0–0.5)
Eosinophils Relative: 2 %
HCT: 37.1 % (ref 36.0–46.0)
Hemoglobin: 12.1 g/dL (ref 12.0–15.0)
Immature Granulocytes: 0 %
LYMPHS PCT: 34 %
Lymphs Abs: 2.5 10*3/uL (ref 0.7–4.0)
MCH: 28.2 pg (ref 26.0–34.0)
MCHC: 32.6 g/dL (ref 30.0–36.0)
MCV: 86.5 fL (ref 80.0–100.0)
MONO ABS: 0.5 10*3/uL (ref 0.1–1.0)
Monocytes Relative: 6 %
NEUTROS ABS: 4.3 10*3/uL (ref 1.7–7.7)
NEUTROS PCT: 58 %
PLATELETS: 226 10*3/uL (ref 150–400)
RBC: 4.29 MIL/uL (ref 3.87–5.11)
RDW: 13.7 % (ref 11.5–15.5)
WBC: 7.4 10*3/uL (ref 4.0–10.5)
nRBC: 0 % (ref 0.0–0.2)

## 2018-06-27 LAB — HEMOGLOBIN A1C
HEMOGLOBIN A1C: 6.2 % — AB (ref 4.8–5.6)
MEAN PLASMA GLUCOSE: 131.24 mg/dL

## 2018-06-27 LAB — GLUCOSE, CAPILLARY: GLUCOSE-CAPILLARY: 140 mg/dL — AB (ref 70–99)

## 2018-06-29 NOTE — Progress Notes (Addendum)
K+ 3.0 reported to Dr Sharee Pimple. Instruct patient to take her potassium daily. Patient called to keep taking Potassium daily.

## 2018-06-30 ENCOUNTER — Encounter (HOSPITAL_COMMUNITY)
Admission: RE | Disposition: A | Discharge status: Home / Self Care | Payer: Self-pay | Source: Ambulatory Visit | Attending: Ophthalmology

## 2018-06-30 ENCOUNTER — Ambulatory Visit (HOSPITAL_COMMUNITY): Payer: Medicare Other | Admitting: Anesthesiology

## 2018-06-30 ENCOUNTER — Ambulatory Visit (HOSPITAL_COMMUNITY)
Admission: RE | Admit: 2018-06-30 | Discharge: 2018-06-30 | Disposition: A | Discharge status: Home / Self Care | Payer: Medicare Other | Source: Ambulatory Visit | Attending: Ophthalmology | Admitting: Ophthalmology

## 2018-06-30 ENCOUNTER — Encounter (HOSPITAL_COMMUNITY): Payer: Self-pay | Admitting: Anesthesiology

## 2018-06-30 DIAGNOSIS — E7800 Pure hypercholesterolemia, unspecified: Secondary | ICD-10-CM | POA: Diagnosis not present

## 2018-06-30 DIAGNOSIS — Z79899 Other long term (current) drug therapy: Secondary | ICD-10-CM | POA: Insufficient documentation

## 2018-06-30 DIAGNOSIS — I10 Essential (primary) hypertension: Secondary | ICD-10-CM | POA: Insufficient documentation

## 2018-06-30 DIAGNOSIS — Z7984 Long term (current) use of oral hypoglycemic drugs: Secondary | ICD-10-CM | POA: Diagnosis not present

## 2018-06-30 DIAGNOSIS — E119 Type 2 diabetes mellitus without complications: Secondary | ICD-10-CM | POA: Diagnosis not present

## 2018-06-30 DIAGNOSIS — H25811 Combined forms of age-related cataract, right eye: Secondary | ICD-10-CM | POA: Diagnosis not present

## 2018-06-30 DIAGNOSIS — H2511 Age-related nuclear cataract, right eye: Secondary | ICD-10-CM | POA: Diagnosis not present

## 2018-06-30 HISTORY — PX: CATARACT EXTRACTION W/PHACO: SHX586

## 2018-06-30 LAB — GLUCOSE, CAPILLARY: Glucose-Capillary: 114 mg/dL — ABNORMAL HIGH (ref 70–99)

## 2018-06-30 SURGERY — PHACOEMULSIFICATION, CATARACT, WITH IOL INSERTION
Anesthesia: Monitor Anesthesia Care | Site: Eye | Laterality: Right

## 2018-06-30 MED ORDER — POVIDONE-IODINE 5 % OP SOLN
OPHTHALMIC | Status: DC | PRN
  Administered 2018-06-30: 1 via OPHTHALMIC

## 2018-06-30 MED ORDER — MIDAZOLAM HCL 2 MG/2ML IJ SOLN
INTRAMUSCULAR | Status: DC | PRN
  Administered 2018-06-30: 2 mg via INTRAVENOUS

## 2018-06-30 MED ORDER — LIDOCAINE HCL 3.5 % OP GEL
1.0000 "application " | Freq: Once | OPHTHALMIC | Status: AC
  Administered 2018-06-30: 1 via OPHTHALMIC

## 2018-06-30 MED ORDER — TETRACAINE HCL 0.5 % OP SOLN
1.0000 [drp] | OPHTHALMIC | Status: AC
  Administered 2018-06-30 (×2): 1 [drp] via OPHTHALMIC

## 2018-06-30 MED ORDER — PROVISC 10 MG/ML IO SOLN
INTRAOCULAR | Status: DC | PRN
  Administered 2018-06-30: 0.85 mL via INTRAOCULAR

## 2018-06-30 MED ORDER — MIDAZOLAM HCL 2 MG/2ML IJ SOLN
INTRAMUSCULAR | Status: AC
  Filled 2018-06-30: qty 2

## 2018-06-30 MED ORDER — LIDOCAINE 3.5 % OP GEL OPTIME - NO CHARGE
OPHTHALMIC | Status: DC | PRN
  Administered 2018-06-30: 2 [drp] via OPHTHALMIC

## 2018-06-30 MED ORDER — LIDOCAINE HCL (PF) 1 % IJ SOLN
INTRAOCULAR | Status: DC | PRN
  Administered 2018-06-30: 1 mL via OPHTHALMIC

## 2018-06-30 MED ORDER — PHENYLEPHRINE HCL 2.5 % OP SOLN
1.0000 [drp] | OPHTHALMIC | Status: AC
  Administered 2018-06-30 (×2): 1 [drp] via OPHTHALMIC

## 2018-06-30 MED ORDER — SODIUM HYALURONATE 23 MG/ML IO SOLN
INTRAOCULAR | Status: DC | PRN
  Administered 2018-06-30: 0.6 mL via INTRAOCULAR

## 2018-06-30 MED ORDER — CYCLOPENTOLATE-PHENYLEPHRINE 0.2-1 % OP SOLN
1.0000 [drp] | OPHTHALMIC | Status: AC
  Administered 2018-06-30 (×2): 1 [drp] via OPHTHALMIC

## 2018-06-30 MED ORDER — EPINEPHRINE PF 1 MG/ML IJ SOLN
INTRAOCULAR | Status: DC | PRN
  Administered 2018-06-30: 500 mL

## 2018-06-30 MED ORDER — NEOMYCIN-POLYMYXIN-DEXAMETH 3.5-10000-0.1 OP SUSP
OPHTHALMIC | Status: DC | PRN
  Administered 2018-06-30: 2 [drp] via OPHTHALMIC

## 2018-06-30 MED ORDER — BSS IO SOLN
INTRAOCULAR | Status: DC | PRN
  Administered 2018-06-30: 15 mL

## 2018-06-30 MED ORDER — LACTATED RINGERS IV SOLN
INTRAVENOUS | Status: DC | PRN
  Administered 2018-06-30: 1000 mL
  Administered 2018-06-30: 08:00:00 via INTRAVENOUS

## 2018-06-30 MED ORDER — LACTATED RINGERS IV SOLN: INTRAVENOUS | Status: DC

## 2018-06-30 SURGICAL SUPPLY — 13 items
CLOTH BEACON ORANGE TIMEOUT ST (SAFETY) ×2 IMPLANT
EYE SHIELD UNIVERSAL CLEAR (GAUZE/BANDAGES/DRESSINGS) ×2 IMPLANT
GLOVE BIOGEL PI IND STRL 7.0 (GLOVE) IMPLANT
GLOVE BIOGEL PI INDICATOR 7.0 (GLOVE) ×4
LENS ALC ACRYL/TECN (Ophthalmic Related) ×2 IMPLANT
NDL HYPO 18GX1.5 BLUNT FILL (NEEDLE) IMPLANT
NEEDLE HYPO 18GX1.5 BLUNT FILL (NEEDLE) ×3 IMPLANT
PAD ARMBOARD 7.5X6 YLW CONV (MISCELLANEOUS) ×2 IMPLANT
SYR TB 1ML LL NO SAFETY (SYRINGE) ×2 IMPLANT
TAPE SURG TRANSPORE 1 IN (GAUZE/BANDAGES/DRESSINGS) IMPLANT
TAPE SURGICAL TRANSPORE 1 IN (GAUZE/BANDAGES/DRESSINGS) ×2
VISCOELASTIC ADDITIONAL (OPHTHALMIC RELATED) ×2 IMPLANT
WATER STERILE IRR 1000ML POUR (IV SOLUTION) ×2 IMPLANT

## 2018-06-30 NOTE — Anesthesia Procedure Notes (Signed)
Procedure Name: MAC Date/Time: 06/30/2018 9:09 AM Performed by: Vista Deck, CRNA Pre-anesthesia Checklist: Patient identified, Emergency Drugs available, Suction available, Timeout performed and Patient being monitored Patient Re-evaluated:Patient Re-evaluated prior to induction Oxygen Delivery Method: Nasal Cannula

## 2018-06-30 NOTE — Anesthesia Postprocedure Evaluation (Signed)
Anesthesia Post Note  Patient: Jennifer Short  Procedure(s) Performed: CATARACT EXTRACTION PHACO AND INTRAOCULAR LENS PLACEMENT RIGHT EYE CDE=5.98 (Right Eye)  Patient location during evaluation: Short Stay Anesthesia Type: MAC Level of consciousness: awake and alert Pain management: pain level controlled Vital Signs Assessment: post-procedure vital signs reviewed and stable Respiratory status: spontaneous breathing Cardiovascular status: stable Postop Assessment: no apparent nausea or vomiting Anesthetic complications: no     Last Vitals:  Vitals:   06/30/18 0753  BP: (!) 179/66  Pulse: 70  Resp: 20  Temp: 36.4 C  SpO2: 98%    Last Pain:  Vitals:   06/30/18 0753  TempSrc: Oral  PainSc: 0-No pain                 Lacy Taglieri

## 2018-06-30 NOTE — Transfer of Care (Signed)
Immediate Anesthesia Transfer of Care Note  Patient: Jennifer Short  Procedure(s) Performed: CATARACT EXTRACTION PHACO AND INTRAOCULAR LENS PLACEMENT RIGHT EYE CDE=5.98 (Right Eye)  Patient Location: Short Stay  Anesthesia Type:MAC  Level of Consciousness: awake, alert  and patient cooperative  Airway & Oxygen Therapy: Patient Spontanous Breathing  Post-op Assessment: Report given to RN and Post -op Vital signs reviewed and stable  Post vital signs: Reviewed and stable  Last Vitals:  Vitals Value Taken Time  BP    Temp    Pulse    Resp    SpO2      Last Pain:  Vitals:   06/30/18 0753  TempSrc: Oral  PainSc: 0-No pain      Patients Stated Pain Goal: 4 (15/40/08 6761)  Complications: No apparent anesthesia complications

## 2018-06-30 NOTE — H&P (Signed)
The H and P was reviewed and updated. The patient was examined.  No changes were found after exam.  The surgical eye was marked.  

## 2018-06-30 NOTE — Anesthesia Preprocedure Evaluation (Signed)
Anesthesia Evaluation  Patient identified by MRN, date of birth, ID band Patient awake    Reviewed: Allergy & Precautions, H&P , NPO status , Patient's Chart, lab work & pertinent test results  Airway Mallampati: III  TM Distance: >3 FB Neck ROM: full    Dental no notable dental hx. (+) Missing, Chipped, Poor Dentition   Pulmonary neg pulmonary ROS,    Pulmonary exam normal breath sounds clear to auscultation       Cardiovascular Exercise Tolerance: Good hypertension, negative cardio ROS   Rhythm:regular Rate:Normal     Neuro/Psych negative neurological ROS  negative psych ROS   GI/Hepatic negative GI ROS, Neg liver ROS,   Endo/Other  negative endocrine ROSdiabetes  Renal/GU negative Renal ROS  negative genitourinary   Musculoskeletal   Abdominal   Peds  Hematology negative hematology ROS (+)   Anesthesia Other Findings   Reproductive/Obstetrics negative OB ROS                             Anesthesia Physical Anesthesia Plan  ASA: III  Anesthesia Plan: MAC   Post-op Pain Management:    Induction:   PONV Risk Score and Plan:   Airway Management Planned:   Additional Equipment:   Intra-op Plan:   Post-operative Plan:   Informed Consent: I have reviewed the patients History and Physical, chart, labs and discussed the procedure including the risks, benefits and alternatives for the proposed anesthesia with the patient or authorized representative who has indicated his/her understanding and acceptance.   Dental Advisory Given  Plan Discussed with: CRNA  Anesthesia Plan Comments:         Anesthesia Quick Evaluation

## 2018-06-30 NOTE — Discharge Instructions (Signed)
Please discharge patient when stable, will follow up today with Dr. Anajulia Leyendecker at the [facility] office immediately following discharge.  Leave shield in place until visit.  All paperwork with discharge instructions will be given at the office. ° ° °Cataract Surgery, Care After °Refer to this sheet in the next few weeks. These instructions provide you with information about caring for yourself after your procedure. Your health care provider may also give you more specific instructions. Your treatment has been planned according to current medical practices, but problems sometimes occur. Call your health care provider if you have any problems or questions after your procedure. °What can I expect after the procedure? °After the procedure, it is common to have: °· Itching. °· Discomfort. °· Fluid discharge. °· Sensitivity to light and to touch. °· Bruising. ° °Follow these instructions at home: °Eye Care °· Check your eye every day for signs of infection. Watch for: °? Redness, swelling, or pain. °? Fluid, blood, or pus. °? Warmth. °? Bad smell. °Activity °· Avoid strenuous activities, such as playing contact sports, for as long as told by your health care provider. °· Do not drive or operate heavy machinery until your health care provider approves. °· Do not bend or lift heavy objects . Bending increases pressure in the eye. You can walk, climb stairs, and do light household chores. °· Ask your health care provider when you can return to work. If you work in a dusty environment, you may be advised to wear protective eyewear for a period of time. °General instructions °· Take or apply over-the-counter and prescription medicines only as told by your health care provider. This includes eye drops. °· Do not touch or rub your eyes. °· If you were given a protective shield, wear it as told by your health care provider. If you were not given a protective shield, wear sunglasses as told by your health care provider to  protect your eyes. °· Keep the area around your eye clean and dry. Avoid swimming or allowing water to hit you directly in the face while showering until told by your health care provider. Keep soap and shampoo out of your eyes. °· Do not put a contact lens into the affected eye or eyes until your health care provider approves. °· Keep all follow-up visits as told by your health care provider. This is important. °Contact a health care provider if: ° °· You have increased bruising around your eye. °· You have pain that is not helped with medicine. °· You have a fever. °· You have redness, swelling, or pain in your eye. °· You have fluid, blood, or pus coming from your incision. °· Your vision gets worse. °Get help right away if: °· You have sudden vision loss. °This information is not intended to replace advice given to you by your health care provider. Make sure you discuss any questions you have with your health care provider. °Document Released: 03/19/2005 Document Revised: 01/08/2016 Document Reviewed: 07/10/2015 °Elsevier Interactive Patient Education © 2018 [employer] °PATIENT INSTRUCTIONS °POST-ANESTHESIA ° °IMMEDIATELY FOLLOWING SURGERY:  Do not drive or operate machinery for the first twenty four hours after surgery.  Do not make any important decisions for twenty four hours after surgery or while taking narcotic pain medications or sedatives.  If you develop intractable nausea and vomiting or a severe headache please notify your doctor immediately. ° °FOLLOW-UP:  Please make an appointment with your surgeon as instructed. You do not need to follow up with anesthesia   unless specifically instructed to do so. ° °WOUND CARE INSTRUCTIONS (if applicable):  Keep a dry clean dressing on the anesthesia/puncture wound site if there is drainage.  Once the wound has quit draining you may leave it open to air.  Generally you should leave the bandage intact for twenty four hours unless there is drainage.  If the  epidural site drains for more than 36-48 hours please call the anesthesia department. ° °QUESTIONS?:  Please feel free to call your physician or the hospital operator if you have any questions, and they will be happy to assist you.    ° ° ° °

## 2018-06-30 NOTE — Op Note (Signed)
Date of procedure: 06/30/18  Pre-operative diagnosis: Visually significant cataract, Right Eye (H25.811)  Post-operative diagnosis: Visually significant cataract, Right Eye  Procedure: Removal of cataract via phacoemulsification and insertion of intra-ocular lens [name] and [employer] PCB00  +22.5D into the capsular bag of the Right Eye  Attending surgeon: Gerda Diss. Malikah Lakey, MD, MA  Anesthesia: MAC, Topical Akten  Complications: None  Estimated Blood Loss: <50m (minimal)  Specimens: None  Implants: As above  Indications:  Visually significant cataract, Right Eye  Procedure:  The patient was seen and identified in the pre-operative area. The operative eye was identified and dilated.  The operative eye was marked.  Topical anesthesia was administered to the operative eye.     The patient was then to the operative suite and placed in the supine position.  A timeout was performed confirming the patient, procedure to be performed, and all other relevant information.   The patient's face was prepped and draped in the usual fashion for intra-ocular surgery.  A lid speculum was placed into the operative eye and the surgical microscope moved into place and focused.  A superotemporal paracentesis was created using a 20 gauge paracentesis blade.  Shugarcaine was injected into the anterior chamber.  Viscoelastic was injected into the anterior chamber.  A temporal clear-corneal main wound incision was created using a 2.477mmicrokeratome.  A continuous curvilinear capsulorrhexis was initiated using an irrigating cystitome and completed using capsulorrhexis forceps.  Hydrodissection and hydrodeliniation were performed.  Viscoelastic was injected into the anterior chamber.  A phacoemulsification handpiece and a chopper as a second instrument were used to remove the nucleus and epinucleus. The irrigation/aspiration handpiece was used to remove any remaining cortical material.   The capsular bag was  reinflated with viscoelastic, checked, and found to be intact.  The intraocular lens was inserted into the capsular bag and dialed into place using a Kuglen hook.  The irrigation/aspiration handpiece was used to remove any remaining viscoelastic.  The clear corneal wound and paracentesis wounds were then hydrated and checked with Weck-Cels to be watertight.  The lid-speculum and drape was removed, and the patient's face was cleaned with a wet and dry 4x4.  Maxitrol was instilled in the eye before a clear shield was taped over the eye. The patient was taken to the post-operative care unit in good condition, having tolerated the procedure well.  Post-Op Instructions: [facility] and will receive all other orders and instructions.

## 2018-07-03 ENCOUNTER — Encounter (HOSPITAL_COMMUNITY): Payer: Self-pay | Admitting: Ophthalmology

## 2018-08-31 DIAGNOSIS — E119 Type 2 diabetes mellitus without complications: Secondary | ICD-10-CM | POA: Diagnosis not present

## 2018-08-31 DIAGNOSIS — I10 Essential (primary) hypertension: Secondary | ICD-10-CM | POA: Diagnosis not present

## 2018-08-31 DIAGNOSIS — E7849 Other hyperlipidemia: Secondary | ICD-10-CM | POA: Diagnosis not present

## 2018-09-21 DIAGNOSIS — H25812 Combined forms of age-related cataract, left eye: Secondary | ICD-10-CM | POA: Diagnosis not present

## 2018-09-21 DIAGNOSIS — Z9841 Cataract extraction status, right eye: Secondary | ICD-10-CM | POA: Diagnosis not present

## 2018-11-20 ENCOUNTER — Other Ambulatory Visit (HOSPITAL_COMMUNITY): Payer: Self-pay | Admitting: Internal Medicine

## 2018-11-20 DIAGNOSIS — Z1231 Encounter for screening mammogram for malignant neoplasm of breast: Secondary | ICD-10-CM

## 2018-11-30 IMAGING — MG DIGITAL SCREENING BILATERAL MAMMOGRAM WITH CAD
3 series · 3 of 3 positions shown · non-contrast
Comparison: Previous exam(s).

CLINICAL DATA: Screening.

EXAM:
DIGITAL SCREENING BILATERAL MAMMOGRAM WITH CAD

[R MLO]
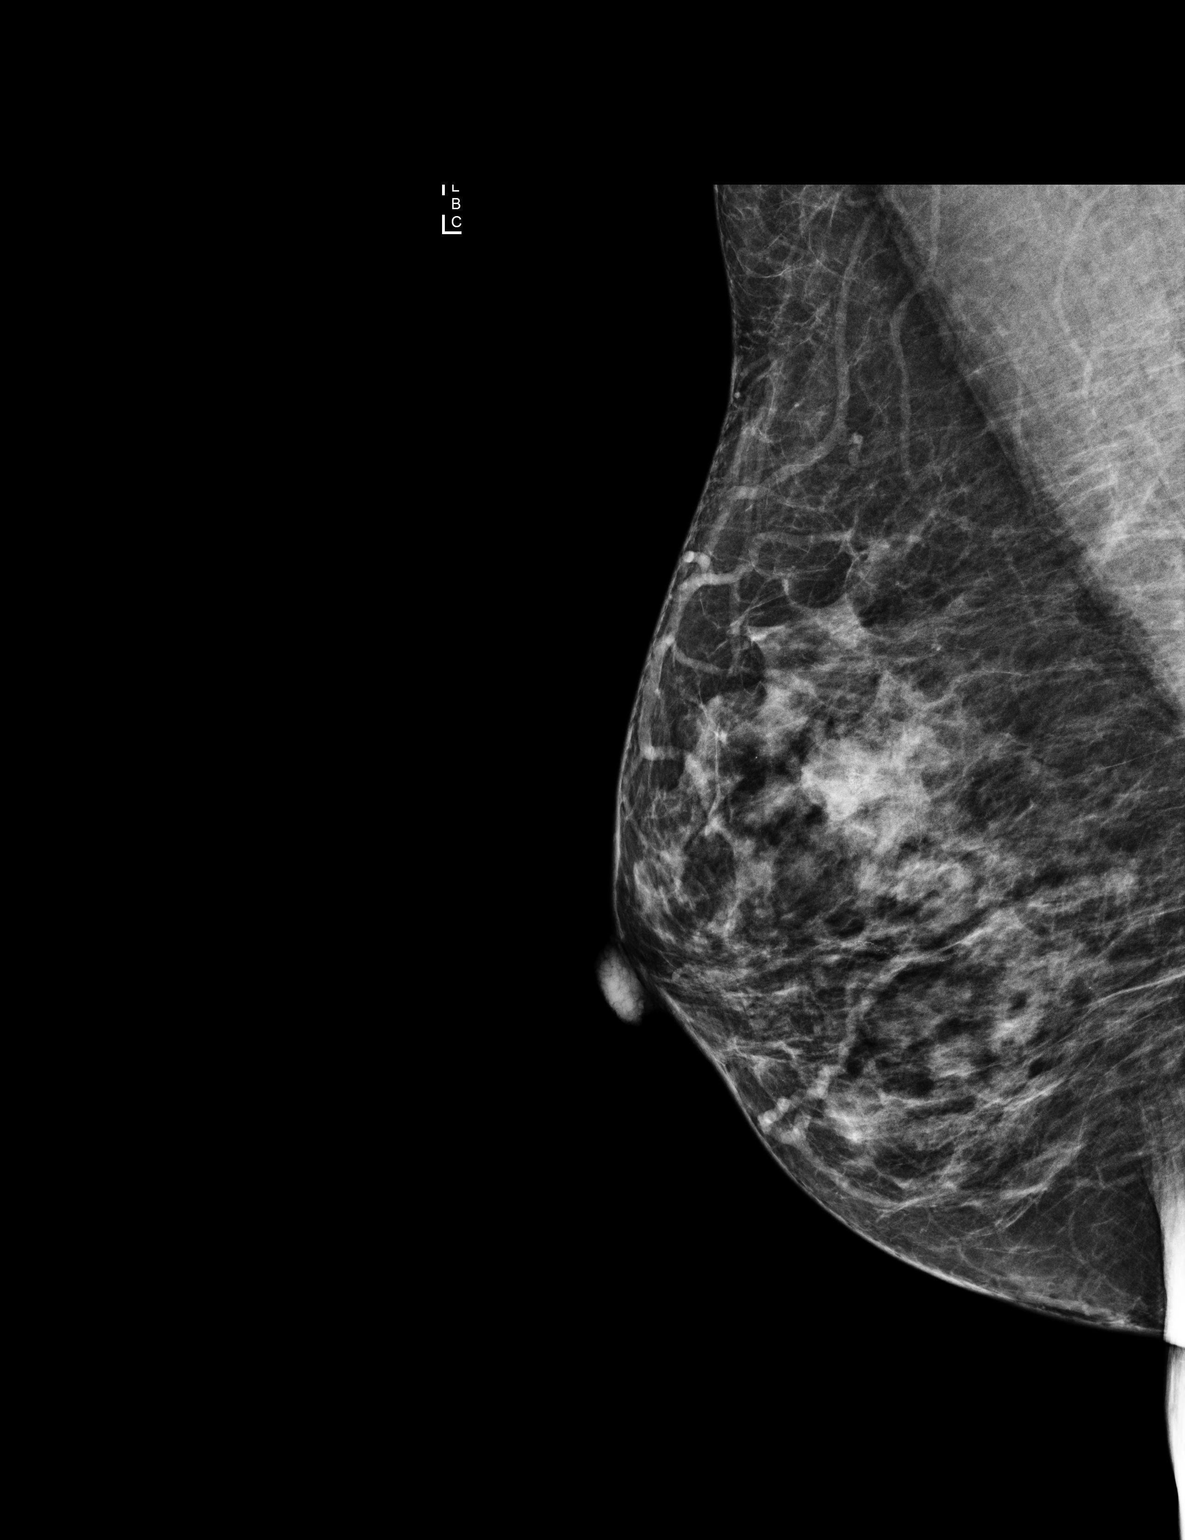

[L MLO]
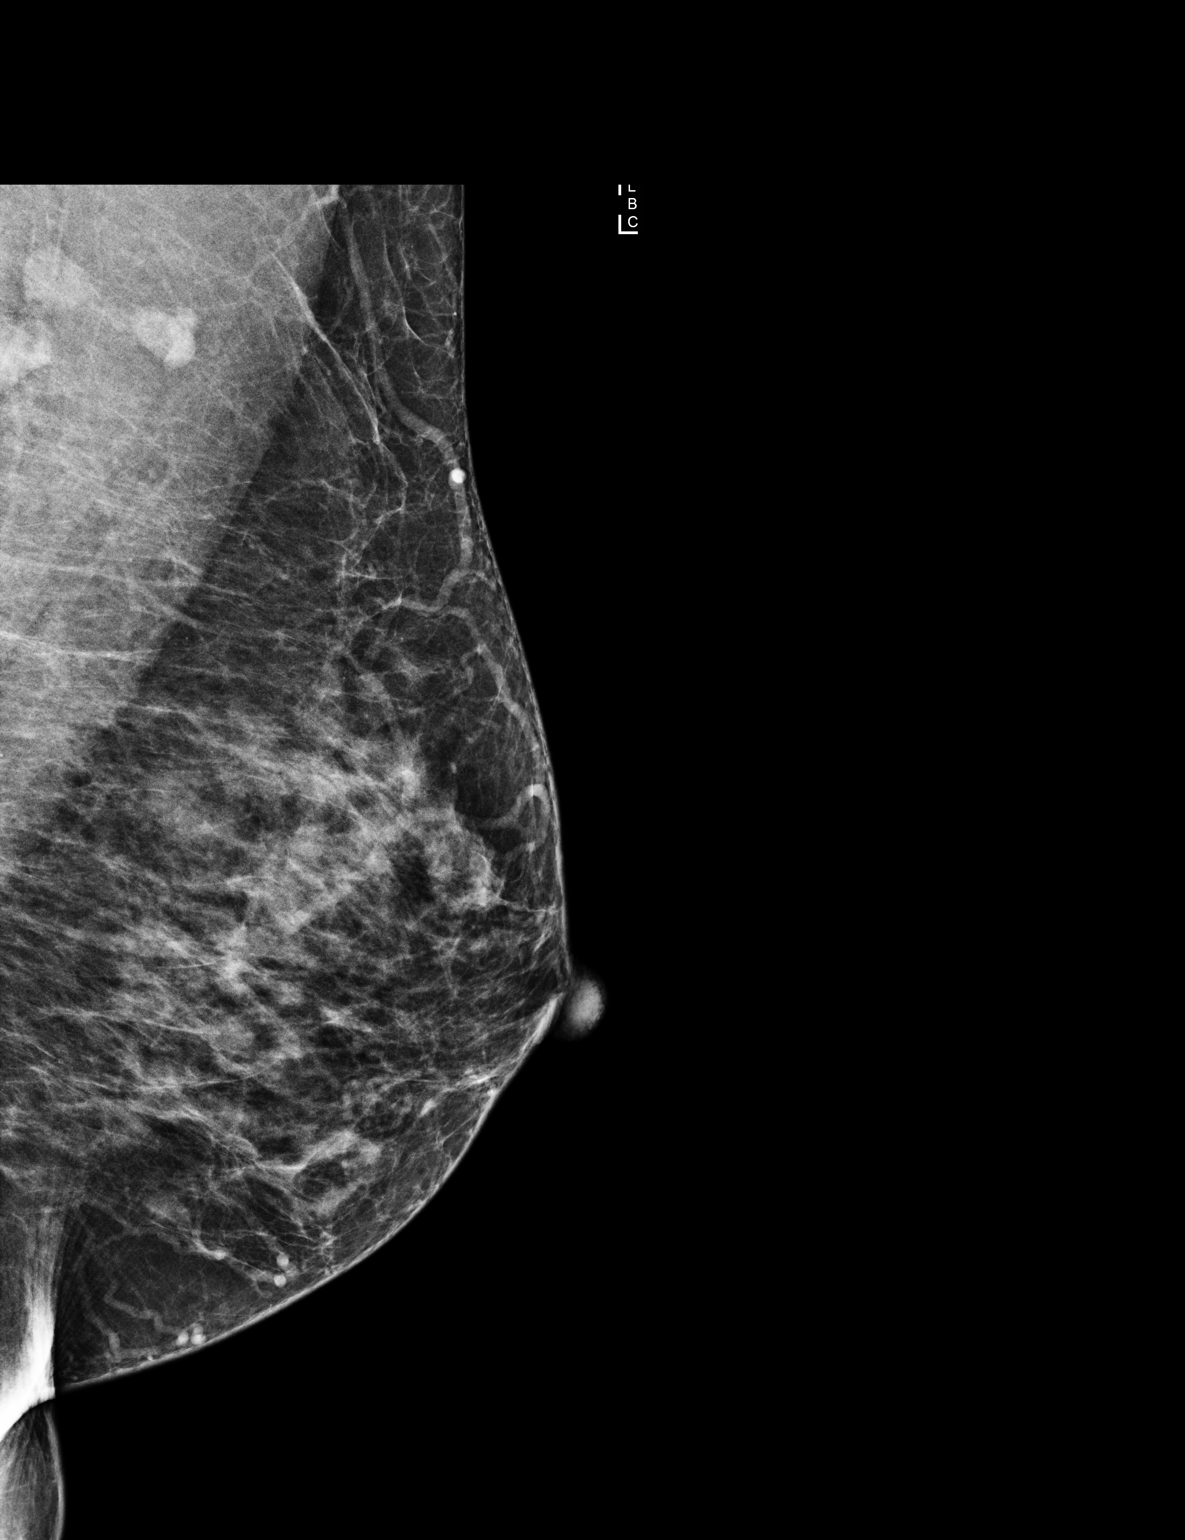

[R CC]
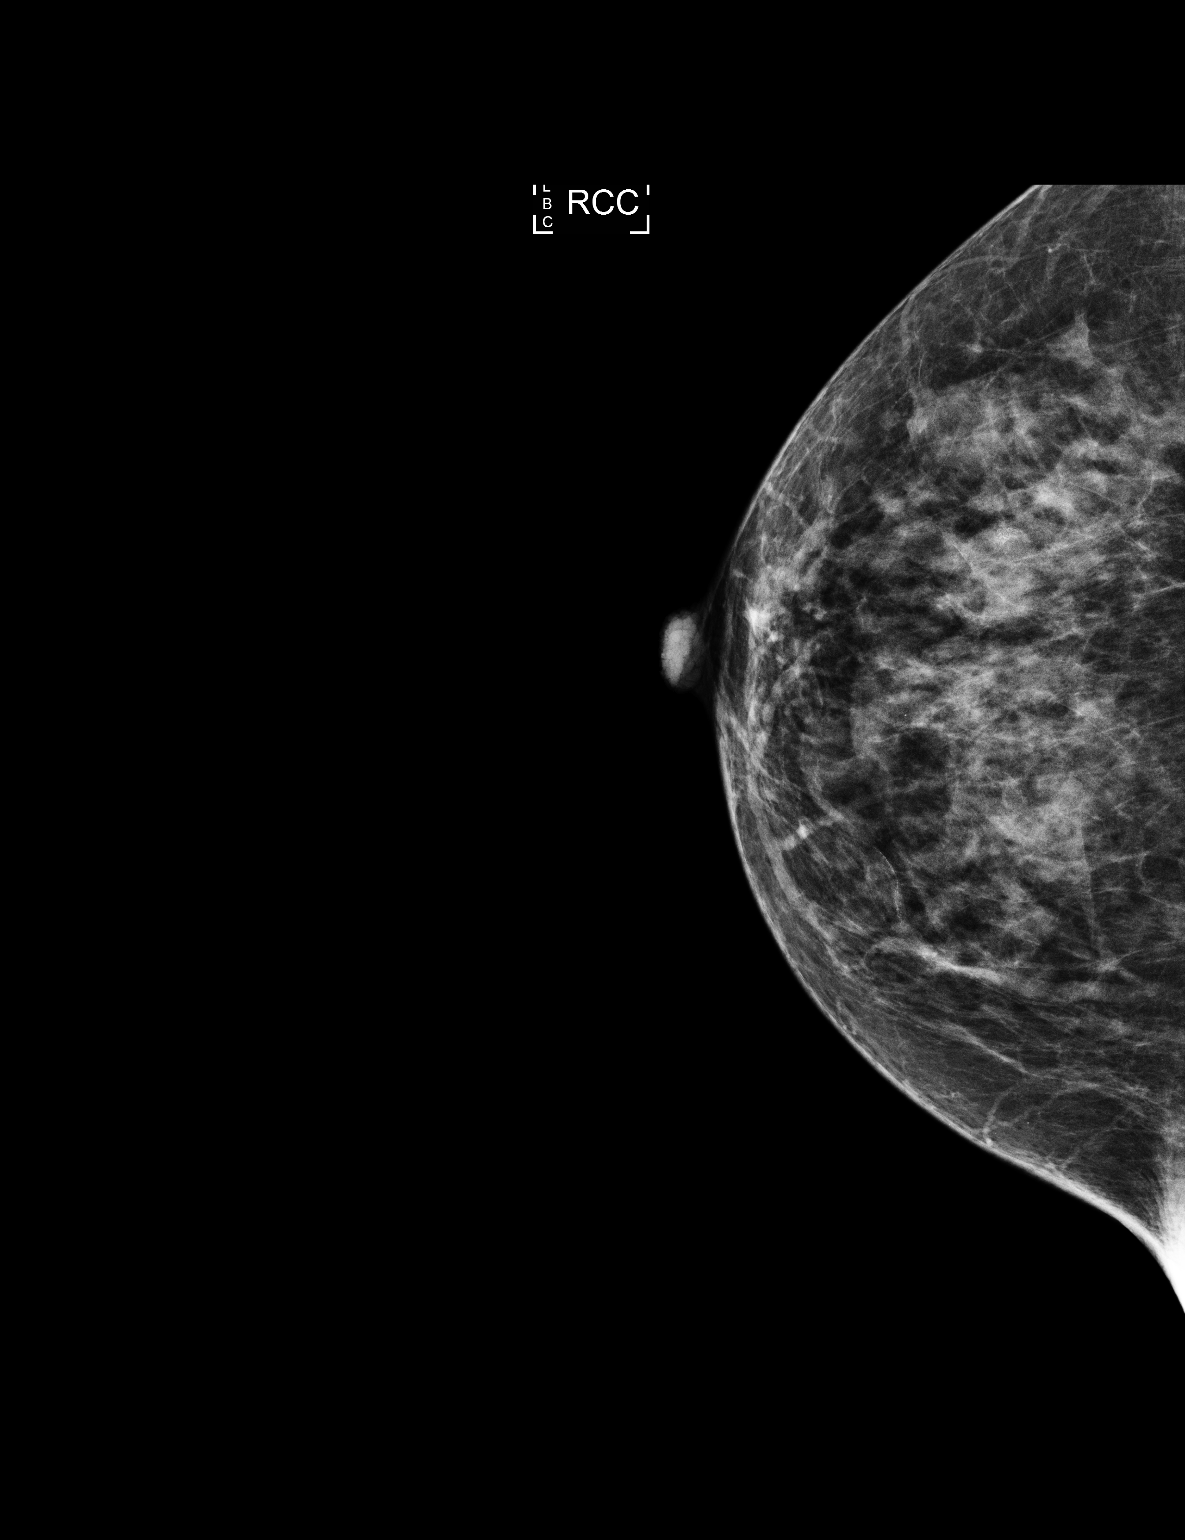

[3 of 3 positions shown; findings below may reference images not displayed]

ACR Breast Density Category c: The breast tissue is heterogeneously
dense, which may obscure small masses.
FINDINGS: There are no findings suspicious for malignancy. Images were
processed with CAD.
IMPRESSION: No mammographic evidence of malignancy. A result letter of this
screening mammogram will be mailed directly to the patient.

RECOMMENDATION:
Screening mammogram in one year. (Code:[id number])

BI-RADS CATEGORY  1: Negative.

## 2018-12-07 DIAGNOSIS — I10 Essential (primary) hypertension: Secondary | ICD-10-CM | POA: Diagnosis not present

## 2018-12-07 DIAGNOSIS — E119 Type 2 diabetes mellitus without complications: Secondary | ICD-10-CM | POA: Diagnosis not present

## 2018-12-07 DIAGNOSIS — E7849 Other hyperlipidemia: Secondary | ICD-10-CM | POA: Diagnosis not present

## 2019-01-03 ENCOUNTER — Ambulatory Visit (HOSPITAL_COMMUNITY): Payer: Medicare Other

## 2019-02-07 ENCOUNTER — Ambulatory Visit (HOSPITAL_COMMUNITY)
Admission: RE | Admit: 2019-02-07 | Discharge: 2019-02-07 | Disposition: A | Discharge status: Home / Self Care | Payer: Medicare Other | Source: Ambulatory Visit | Attending: Internal Medicine | Admitting: Internal Medicine

## 2019-02-07 ENCOUNTER — Other Ambulatory Visit: Payer: Self-pay

## 2019-02-07 DIAGNOSIS — Z1231 Encounter for screening mammogram for malignant neoplasm of breast: Secondary | ICD-10-CM | POA: Insufficient documentation

## 2019-02-26 DIAGNOSIS — Z961 Presence of intraocular lens: Secondary | ICD-10-CM | POA: Diagnosis not present

## 2019-02-26 DIAGNOSIS — H25812 Combined forms of age-related cataract, left eye: Secondary | ICD-10-CM | POA: Diagnosis not present

## 2019-03-12 DIAGNOSIS — E7849 Other hyperlipidemia: Secondary | ICD-10-CM | POA: Diagnosis not present

## 2019-03-12 DIAGNOSIS — E119 Type 2 diabetes mellitus without complications: Secondary | ICD-10-CM | POA: Diagnosis not present

## 2019-03-12 DIAGNOSIS — I10 Essential (primary) hypertension: Secondary | ICD-10-CM | POA: Diagnosis not present

## 2019-04-16 ENCOUNTER — Other Ambulatory Visit: Payer: Self-pay

## 2019-05-02 DIAGNOSIS — E11319 Type 2 diabetes mellitus with unspecified diabetic retinopathy without macular edema: Secondary | ICD-10-CM | POA: Diagnosis not present

## 2019-06-04 DIAGNOSIS — Z961 Presence of intraocular lens: Secondary | ICD-10-CM | POA: Diagnosis not present

## 2019-06-04 DIAGNOSIS — H25812 Combined forms of age-related cataract, left eye: Secondary | ICD-10-CM | POA: Diagnosis not present

## 2019-06-20 DIAGNOSIS — Z0000 Encounter for general adult medical examination without abnormal findings: Secondary | ICD-10-CM | POA: Diagnosis not present

## 2019-06-20 DIAGNOSIS — E782 Mixed hyperlipidemia: Secondary | ICD-10-CM | POA: Diagnosis not present

## 2019-06-20 DIAGNOSIS — I10 Essential (primary) hypertension: Secondary | ICD-10-CM | POA: Diagnosis not present

## 2019-06-20 DIAGNOSIS — Z1389 Encounter for screening for other disorder: Secondary | ICD-10-CM | POA: Diagnosis not present

## 2019-06-20 DIAGNOSIS — E119 Type 2 diabetes mellitus without complications: Secondary | ICD-10-CM | POA: Diagnosis not present

## 2019-08-12 ENCOUNTER — Emergency Department (HOSPITAL_COMMUNITY)
Admission: EM | Admit: 2019-08-12 | Discharge: 2019-08-12 | Disposition: A | Discharge status: Home / Self Care | Payer: Medicare Other | Attending: Emergency Medicine | Admitting: Emergency Medicine

## 2019-08-12 ENCOUNTER — Emergency Department (HOSPITAL_COMMUNITY): Payer: Medicare Other

## 2019-08-12 ENCOUNTER — Other Ambulatory Visit: Payer: Self-pay

## 2019-08-12 ENCOUNTER — Encounter (HOSPITAL_COMMUNITY): Payer: Self-pay | Admitting: Emergency Medicine

## 2019-08-12 DIAGNOSIS — Z79899 Other long term (current) drug therapy: Secondary | ICD-10-CM | POA: Diagnosis not present

## 2019-08-12 DIAGNOSIS — M25531 Pain in right wrist: Secondary | ICD-10-CM | POA: Insufficient documentation

## 2019-08-12 DIAGNOSIS — M19031 Primary osteoarthritis, right wrist: Secondary | ICD-10-CM | POA: Diagnosis not present

## 2019-08-12 DIAGNOSIS — I10 Essential (primary) hypertension: Secondary | ICD-10-CM | POA: Diagnosis not present

## 2019-08-12 DIAGNOSIS — E119 Type 2 diabetes mellitus without complications: Secondary | ICD-10-CM | POA: Insufficient documentation

## 2019-08-12 NOTE — ED Triage Notes (Signed)
Patient c/o rught wrist pain with swelling that started Friday. Patient denies any known injury. Per patient took 2 Advil last night at 7pm with relief of pain.

## 2019-08-12 NOTE — Discharge Instructions (Addendum)
Wear the splint during the day and with use.  You may remove at bedtime for with bathing.  Elevate your hand, you may apply ice packs on and off to help reduce swelling.  Call the orthopedic provider listed to arrange a follow-up appointment in 1 week if not improving.

## 2019-08-12 NOTE — ED Provider Notes (Signed)
[facility] Provider Note   CSN: [id number] Arrival date & time: 08/12/19  [id number]     History   Chief Complaint Chief Complaint  Patient presents with   Wrist Pain    HPI Jennifer Short is a [age] y.o. female.     HPI   Jennifer Short is a 73 y.o. female with past medical history of diabetes and hypertension, who presents to the Emergency Department complaining of pain and swelling to the right wrist for 2 days.  No known injury.  She describes a throbbing pain to the ulnar aspect of the wrist.  Pain is worse with flexion and extension of the wrist, pain improves at rest.  States that she took Advil last evening with significant improvement of her pain.  She describes her pain is only minimal at present.  She denies redness, numbness of her fingers or hand, and pain proximal to the distal wrist.  No history of gout.  She is right-hand dominant    Past Medical History:  Diagnosis Date   Diabetes mellitus without complication (HCC)    Hypertension     There are no active problems to display for this patient.   Past Surgical History:  Procedure Laterality Date   CATARACT EXTRACTION W/PHACO Right 06/30/2018   Procedure: CATARACT EXTRACTION PHACO AND INTRAOCULAR LENS PLACEMENT RIGHT EYE CDE=5.98;  Surgeon: Fabio PierceWrzosek, James, MD;  Location: [facility];  Service: Ophthalmology;  Laterality: Right;  right   ORIF ANKLE FRACTURE       OB History    Gravida  4   Para  4   Term  2   Preterm  2   AB      Living  4     SAB      TAB      Ectopic      Multiple      Live Births               Home Medications    Prior to Admission medications   Medication Sig Start Date End Date Taking? Authorizing Provider  amLODipine (NORVASC) 5 MG tablet Take 5 mg by mouth daily.    [provider]  benazepril-hydrochlorthiazide (LOTENSIN HCT) 20-25 MG tablet Take 1 tablet by mouth daily.    [provider]  glipiZIDE (GLUCOTROL  XL) 5 MG 24 hr tablet Take 5 mg by mouth daily. 05/16/18   [provider]  lovastatin (MEVACOR) 40 MG tablet Take 40 mg by mouth every evening.  05/16/18   [provider]  Polyethyl Glycol-Propyl Glycol (SYSTANE OP) Place 1 drop into both eyes daily as needed (dry eyes).    [provider]  potassium chloride SA (K-DUR,KLOR-CON) 20 MEQ tablet Take 20 mEq by mouth daily at 2 PM.  05/16/18   [provider]    Family History Family History  Problem Relation Age of Onset   Diabetes Father     Social History Social History   Tobacco Use   Smoking status: Never Smoker   Smokeless tobacco: Never Used  Substance Use Topics   Alcohol use: Yes    Comment: occasional   Drug use: No     Allergies   Patient has no known allergies.   Review of Systems Review of Systems  Constitutional: Negative for chills and fever.  Musculoskeletal: Positive for arthralgias (Right wrist pain and swelling) and joint swelling. Negative for neck pain.  Skin: Negative for color change, rash and wound.  Neurological:  Negative for dizziness, weakness, numbness and headaches.     Physical Exam Updated Vital Signs BP (!) 151/62    Pulse 60    Temp 98.8 F (37.1 C) (Oral)    Ht 5\' 3"  (1.6 m)    Wt 63 kg    SpO2 100%    BMI 24.62 kg/m   Physical Exam Vitals signs and nursing note reviewed.  Constitutional:      Appearance: Normal appearance. She is not ill-appearing.  Neck:     Musculoskeletal: Normal range of motion.  Cardiovascular:     Rate and Rhythm: Normal rate and regular rhythm.     Pulses: Normal pulses.  Pulmonary:     Effort: Pulmonary effort is normal.     Breath sounds: Normal breath sounds.  Musculoskeletal:        General: Swelling and tenderness present. No deformity or signs of injury.     Right wrist: She exhibits tenderness and swelling. She exhibits no bony tenderness, no crepitus and no deformity.     Right hand: She exhibits no finger  abduction and no thumb/finger opposition.     Comments: Localized tenderness to the ulnar aspect of the right distal wrist.  Edema noted from the wrist to the distal right forearm.  No excessive warmth or erythema.  No ecchymosis.  No snuffbox tenderness or bony deformity noted.   Skin:    General: Skin is warm.     Capillary Refill: Capillary refill takes less than 2 seconds.     Findings: No erythema or rash.  Neurological:     General: No focal deficit present.     Mental Status: She is alert.     Sensory: Sensation is intact. No sensory deficit.     Motor: Motor function is intact. No weakness.      ED Treatments / Results  Labs (all labs ordered are listed, but only abnormal results are displayed) Labs Reviewed - No data to display  EKG None  Radiology Dg Wrist Complete Right  Result Date: 08/12/2019 CLINICAL DATA:  Pain near the styloid process of the RIGHT wrist, ulnar aspect. No known injury. EXAM: RIGHT WRIST - COMPLETE 3+ VIEW COMPARISON:  None. FINDINGS: Osseous alignment is normal. Bone mineralization is normal. No acute or suspicious osseous lesion. No fracture line or displaced fracture fragment seen. 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 Mild degenerative sclerosis and subchondral cyst formation at the base of the hamate bone. No large osteophytes or other signs of advanced degenerative osteoarthritis. No evidence of an inflammatory arthritis. Soft tissues about the RIGHT wrist are unremarkable. IMPRESSION: 1. 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 2. Mild degenerative change at the base of the hamate bone. 3. No signs of advanced degenerative osteoarthritis. No signs of an active inflammatory arthritis. 4. No acute findings. Electronically Signed   By: 08/14/2019 M.D.   On: 08/12/2019  09:31    Procedures Procedures (including critical care time)  Medications Ordered in ED Medications - No data to display   Initial Impression / Assessment and Plan / ED Course  I have reviewed the triage vital signs and the nursing notes.  Pertinent labs & imaging results that were available during my care of the patient were reviewed by me and considered in my medical decision making (see chart for details).  Patient with tenderness to the ulnar aspect of the wrist.  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  Wrist splint applied, remains NV intact.  She describes pain as minimal, agrees to splint use and close orthopedic f/u.  Requests referral to local orthopedics.     Final Clinical Impressions(s) / ED Diagnoses   Final diagnoses:  Acute pain of right wrist    ED Discharge Orders    None       Kem Parkinson, PA-C 08/12/19 [place], Julie, MD 08/12/19 1517

## 2019-08-14 DIAGNOSIS — Z23 Encounter for immunization: Secondary | ICD-10-CM | POA: Diagnosis not present

## 2019-09-24 DIAGNOSIS — E119 Type 2 diabetes mellitus without complications: Secondary | ICD-10-CM | POA: Diagnosis not present

## 2019-09-24 DIAGNOSIS — E782 Mixed hyperlipidemia: Secondary | ICD-10-CM | POA: Diagnosis not present

## 2019-09-24 DIAGNOSIS — I10 Essential (primary) hypertension: Secondary | ICD-10-CM | POA: Diagnosis not present

## 2019-11-12 DIAGNOSIS — H26491 Other secondary cataract, right eye: Secondary | ICD-10-CM | POA: Diagnosis not present

## 2019-11-12 DIAGNOSIS — H35033 Hypertensive retinopathy, bilateral: Secondary | ICD-10-CM | POA: Diagnosis not present

## 2019-11-12 DIAGNOSIS — Z7984 Long term (current) use of oral hypoglycemic drugs: Secondary | ICD-10-CM | POA: Diagnosis not present

## 2019-11-12 DIAGNOSIS — I10 Essential (primary) hypertension: Secondary | ICD-10-CM | POA: Diagnosis not present

## 2019-11-12 DIAGNOSIS — H02413 Mechanical ptosis of bilateral eyelids: Secondary | ICD-10-CM | POA: Diagnosis not present

## 2019-11-12 DIAGNOSIS — H04123 Dry eye syndrome of bilateral lacrimal glands: Secondary | ICD-10-CM | POA: Diagnosis not present

## 2019-11-12 DIAGNOSIS — H524 Presbyopia: Secondary | ICD-10-CM | POA: Diagnosis not present

## 2019-11-12 DIAGNOSIS — E119 Type 2 diabetes mellitus without complications: Secondary | ICD-10-CM | POA: Diagnosis not present

## 2019-11-12 DIAGNOSIS — H2512 Age-related nuclear cataract, left eye: Secondary | ICD-10-CM | POA: Diagnosis not present

## 2019-11-12 DIAGNOSIS — H52223 Regular astigmatism, bilateral: Secondary | ICD-10-CM | POA: Diagnosis not present

## 2019-11-12 DIAGNOSIS — Z961 Presence of intraocular lens: Secondary | ICD-10-CM | POA: Diagnosis not present

## 2019-11-12 DIAGNOSIS — H5212 Myopia, left eye: Secondary | ICD-10-CM | POA: Diagnosis not present

## 2019-12-12 DIAGNOSIS — Z23 Encounter for immunization: Secondary | ICD-10-CM | POA: Diagnosis not present

## 2019-12-24 DIAGNOSIS — I10 Essential (primary) hypertension: Secondary | ICD-10-CM | POA: Diagnosis not present

## 2019-12-24 DIAGNOSIS — E119 Type 2 diabetes mellitus without complications: Secondary | ICD-10-CM | POA: Diagnosis not present

## 2019-12-24 DIAGNOSIS — E782 Mixed hyperlipidemia: Secondary | ICD-10-CM | POA: Diagnosis not present

## 2020-01-07 ENCOUNTER — Other Ambulatory Visit (HOSPITAL_COMMUNITY): Payer: Self-pay | Admitting: Internal Medicine

## 2020-01-07 DIAGNOSIS — Z1231 Encounter for screening mammogram for malignant neoplasm of breast: Secondary | ICD-10-CM

## 2020-01-09 DIAGNOSIS — Z23 Encounter for immunization: Secondary | ICD-10-CM | POA: Diagnosis not present

## 2020-01-12 IMAGING — MG DIGITAL SCREENING BILATERAL MAMMOGRAM WITH TOMO AND CAD
6 of 10 series · 6 of 30 positions shown · non-contrast
Comparison: Previous exam(s).

CLINICAL DATA: Screening.

EXAM:
DIGITAL SCREENING BILATERAL MAMMOGRAM WITH TOMO AND CAD

[L MLO synth-2D]
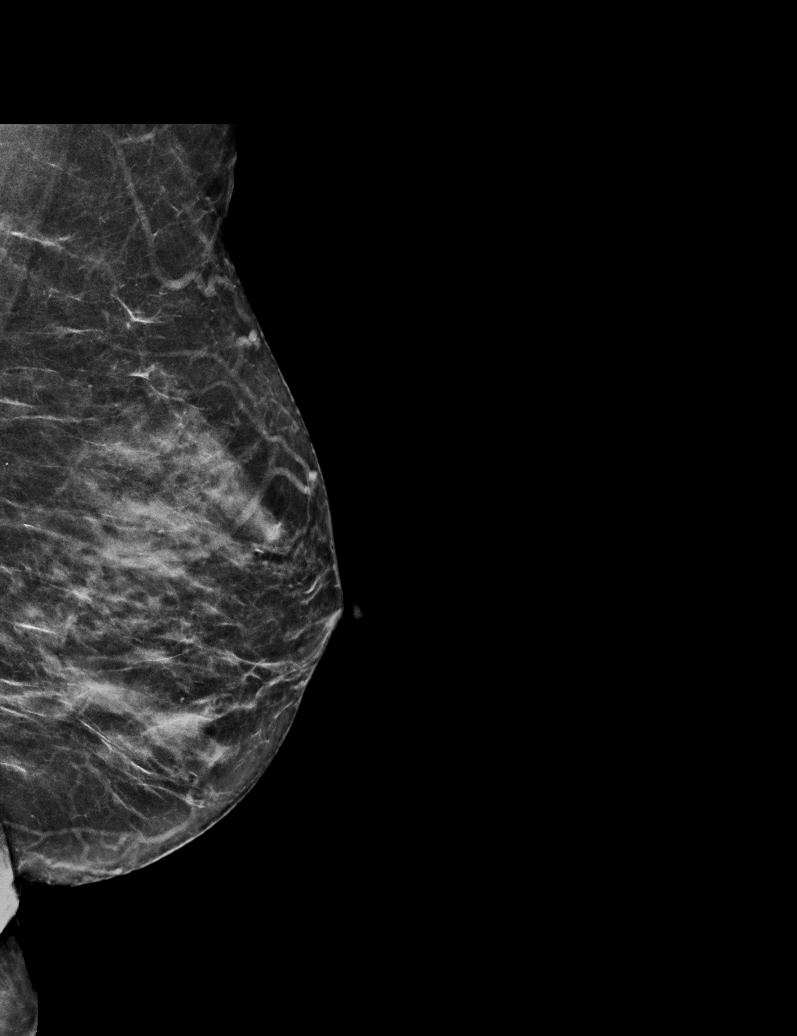

[L CC synth-2D]
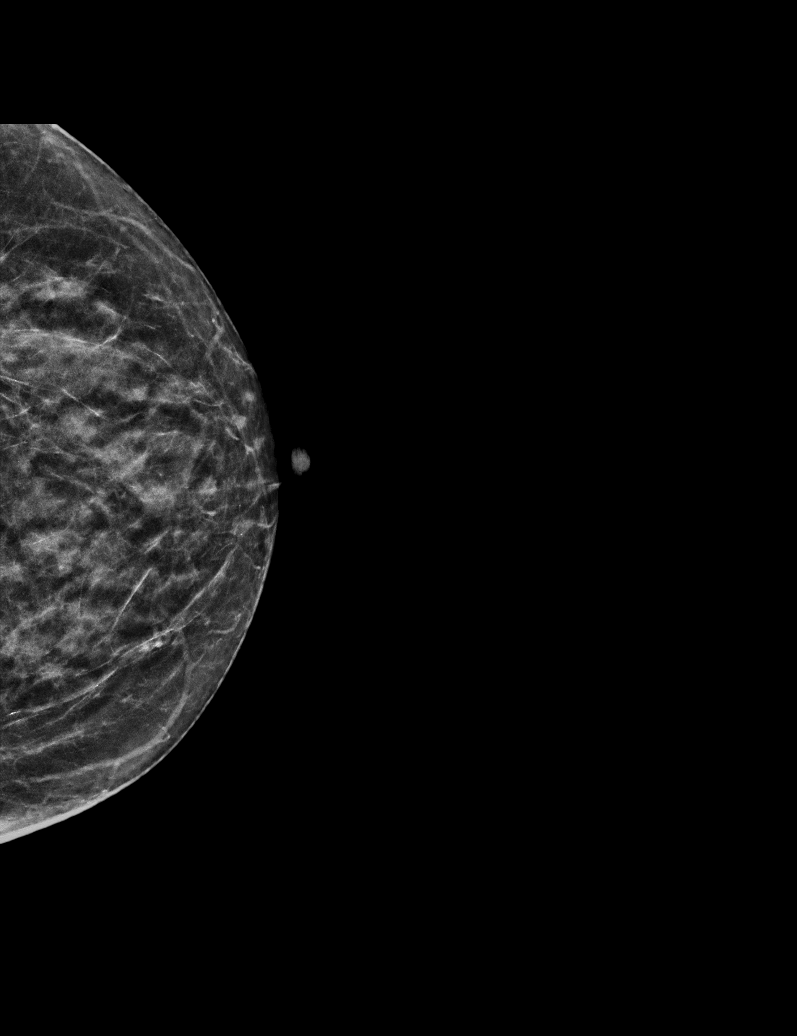

[R CC synth-2D]
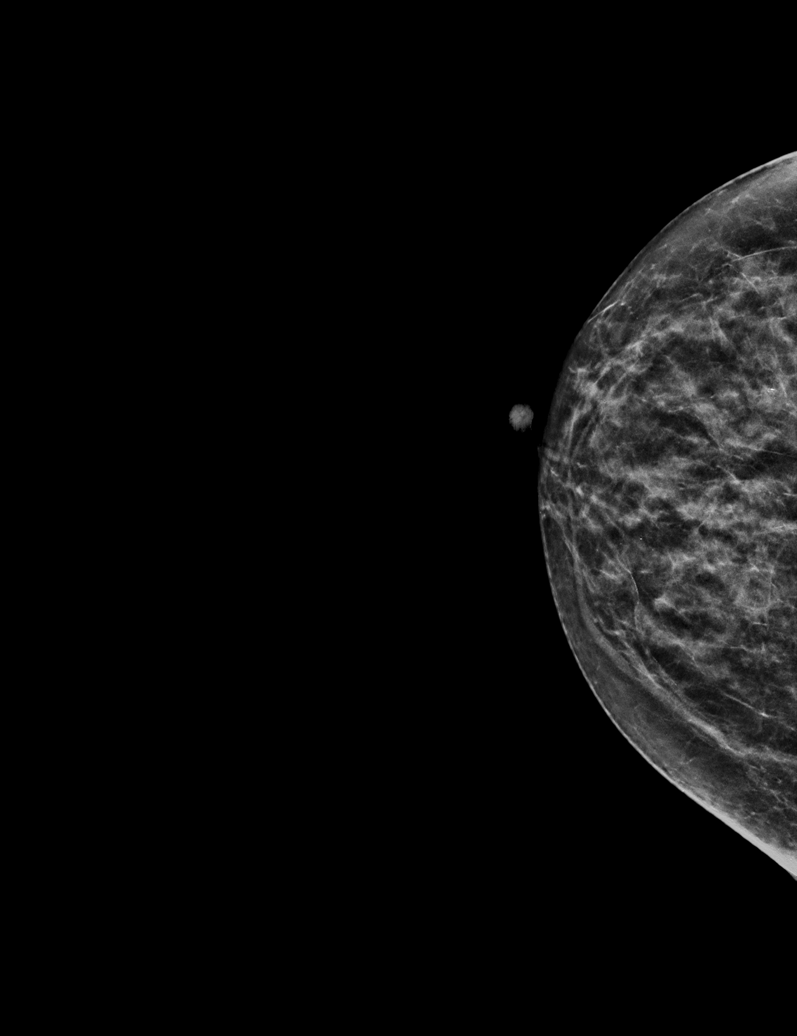

[R XCCL synth-2D]
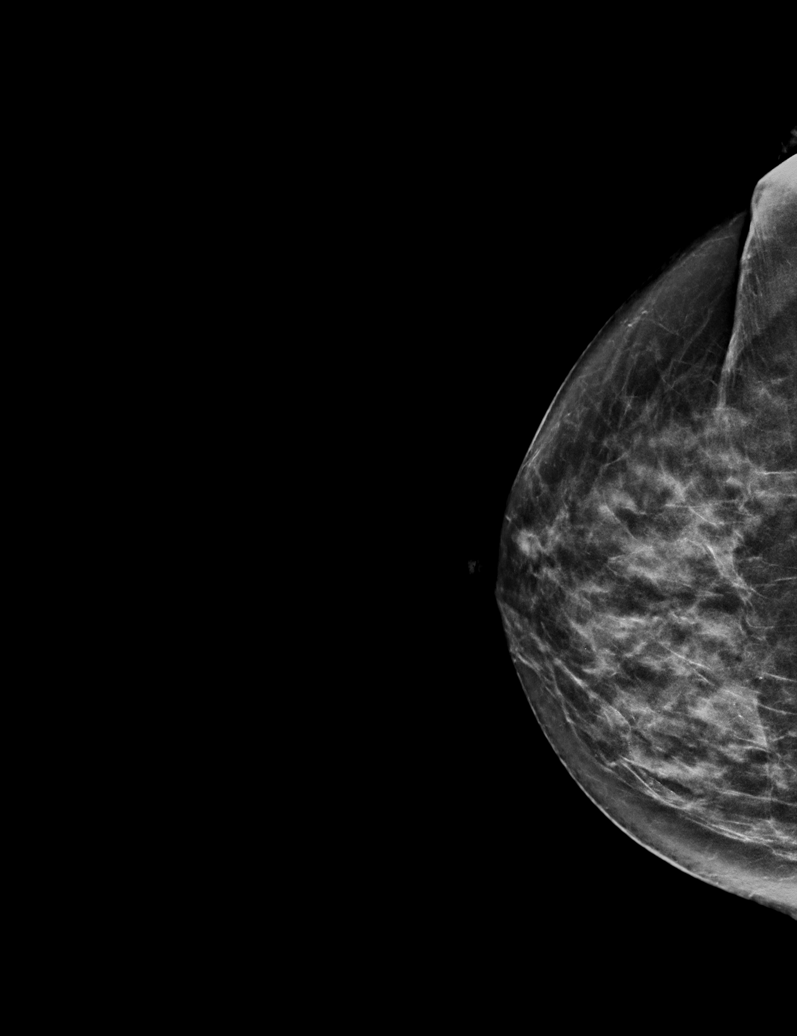

[R MLO synth-2D]
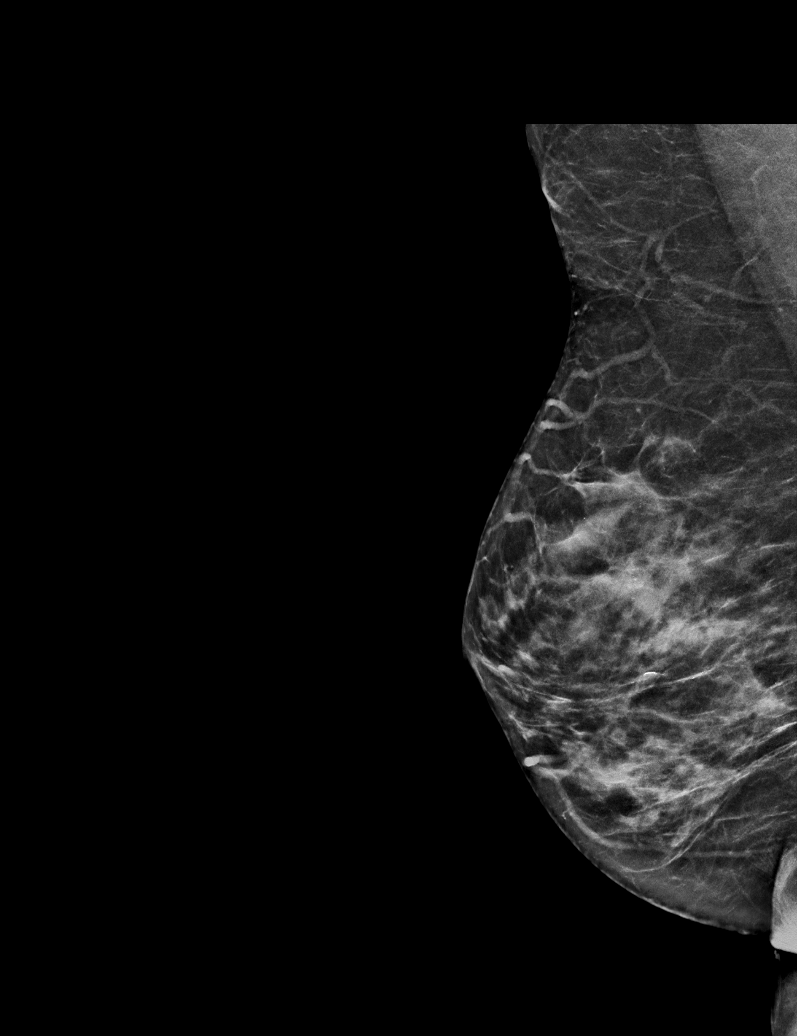

[R MLO tomo · tomo slice 28/55.0]
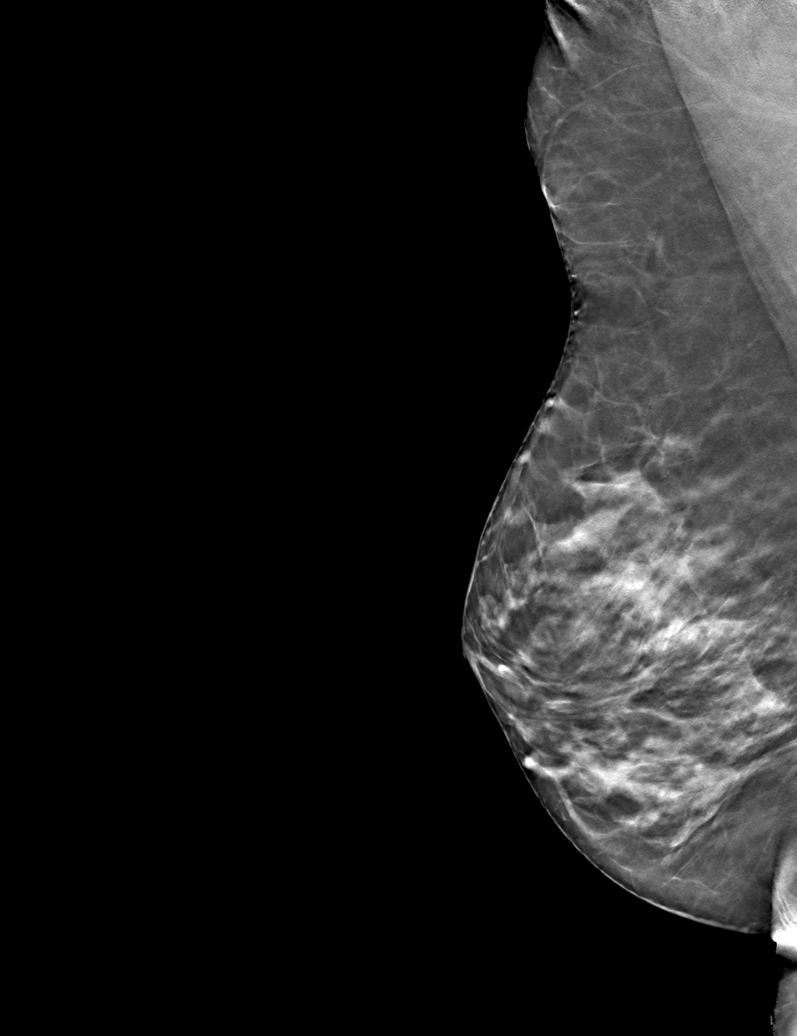

[6 of 30 positions shown; findings below may reference images not displayed]

ACR Breast Density Category c: The breast tissue is heterogeneously
dense, which may obscure small masses.
FINDINGS: There are no findings suspicious for malignancy. Images were
processed with CAD.
IMPRESSION: No mammographic evidence of malignancy. A result letter of this
screening mammogram will be mailed directly to the patient.

RECOMMENDATION:
Screening mammogram in one year. (Code:[id number])

BI-RADS CATEGORY  1: Negative.

## 2020-02-20 ENCOUNTER — Ambulatory Visit (HOSPITAL_COMMUNITY)
Admission: RE | Admit: 2020-02-20 | Discharge: 2020-02-20 | Disposition: A | Discharge status: Home / Self Care | Payer: Medicare Other | Source: Ambulatory Visit | Attending: Internal Medicine | Admitting: Internal Medicine

## 2020-02-20 ENCOUNTER — Other Ambulatory Visit: Payer: Self-pay

## 2020-02-20 DIAGNOSIS — Z1231 Encounter for screening mammogram for malignant neoplasm of breast: Secondary | ICD-10-CM | POA: Insufficient documentation

## 2020-03-20 DIAGNOSIS — H11823 Conjunctivochalasis, bilateral: Secondary | ICD-10-CM | POA: Diagnosis not present

## 2020-03-20 DIAGNOSIS — Z961 Presence of intraocular lens: Secondary | ICD-10-CM | POA: Diagnosis not present

## 2020-03-20 DIAGNOSIS — H01001 Unspecified blepharitis right upper eyelid: Secondary | ICD-10-CM | POA: Diagnosis not present

## 2020-03-20 DIAGNOSIS — H25812 Combined forms of age-related cataract, left eye: Secondary | ICD-10-CM | POA: Diagnosis not present

## 2020-03-25 DIAGNOSIS — E119 Type 2 diabetes mellitus without complications: Secondary | ICD-10-CM | POA: Diagnosis not present

## 2020-03-25 DIAGNOSIS — I10 Essential (primary) hypertension: Secondary | ICD-10-CM | POA: Diagnosis not present

## 2020-03-25 DIAGNOSIS — E782 Mixed hyperlipidemia: Secondary | ICD-10-CM | POA: Diagnosis not present

## 2020-06-05 DIAGNOSIS — H25812 Combined forms of age-related cataract, left eye: Secondary | ICD-10-CM | POA: Diagnosis not present

## 2020-06-05 DIAGNOSIS — H11823 Conjunctivochalasis, bilateral: Secondary | ICD-10-CM | POA: Diagnosis not present

## 2020-06-05 DIAGNOSIS — Z961 Presence of intraocular lens: Secondary | ICD-10-CM | POA: Diagnosis not present

## 2020-06-26 DIAGNOSIS — Z0000 Encounter for general adult medical examination without abnormal findings: Secondary | ICD-10-CM | POA: Diagnosis not present

## 2020-06-26 DIAGNOSIS — E782 Mixed hyperlipidemia: Secondary | ICD-10-CM | POA: Diagnosis not present

## 2020-06-26 DIAGNOSIS — I10 Essential (primary) hypertension: Secondary | ICD-10-CM | POA: Diagnosis not present

## 2020-06-26 DIAGNOSIS — E1169 Type 2 diabetes mellitus with other specified complication: Secondary | ICD-10-CM | POA: Diagnosis not present

## 2020-06-26 DIAGNOSIS — E785 Hyperlipidemia, unspecified: Secondary | ICD-10-CM | POA: Diagnosis not present

## 2020-10-07 DIAGNOSIS — Z0000 Encounter for general adult medical examination without abnormal findings: Secondary | ICD-10-CM | POA: Diagnosis not present

## 2020-10-07 DIAGNOSIS — E782 Mixed hyperlipidemia: Secondary | ICD-10-CM | POA: Diagnosis not present

## 2020-10-07 DIAGNOSIS — I10 Essential (primary) hypertension: Secondary | ICD-10-CM | POA: Diagnosis not present

## 2020-10-07 DIAGNOSIS — E785 Hyperlipidemia, unspecified: Secondary | ICD-10-CM | POA: Diagnosis not present

## 2020-10-07 DIAGNOSIS — E1169 Type 2 diabetes mellitus with other specified complication: Secondary | ICD-10-CM | POA: Diagnosis not present

## 2021-01-07 DIAGNOSIS — I10 Essential (primary) hypertension: Secondary | ICD-10-CM | POA: Diagnosis not present

## 2021-01-07 DIAGNOSIS — E785 Hyperlipidemia, unspecified: Secondary | ICD-10-CM | POA: Diagnosis not present

## 2021-01-07 DIAGNOSIS — Z0000 Encounter for general adult medical examination without abnormal findings: Secondary | ICD-10-CM | POA: Diagnosis not present

## 2021-01-07 DIAGNOSIS — E1169 Type 2 diabetes mellitus with other specified complication: Secondary | ICD-10-CM | POA: Diagnosis not present

## 2021-02-09 DIAGNOSIS — I10 Essential (primary) hypertension: Secondary | ICD-10-CM | POA: Diagnosis not present

## 2021-02-09 DIAGNOSIS — E785 Hyperlipidemia, unspecified: Secondary | ICD-10-CM | POA: Diagnosis not present

## 2021-02-09 DIAGNOSIS — E1169 Type 2 diabetes mellitus with other specified complication: Secondary | ICD-10-CM | POA: Diagnosis not present

## 2021-04-12 DIAGNOSIS — E1169 Type 2 diabetes mellitus with other specified complication: Secondary | ICD-10-CM | POA: Diagnosis not present

## 2021-04-12 DIAGNOSIS — E785 Hyperlipidemia, unspecified: Secondary | ICD-10-CM | POA: Diagnosis not present

## 2021-04-12 DIAGNOSIS — I10 Essential (primary) hypertension: Secondary | ICD-10-CM | POA: Diagnosis not present

## 2021-04-13 DIAGNOSIS — Z0000 Encounter for general adult medical examination without abnormal findings: Secondary | ICD-10-CM | POA: Diagnosis not present

## 2021-04-13 DIAGNOSIS — E785 Hyperlipidemia, unspecified: Secondary | ICD-10-CM | POA: Diagnosis not present

## 2021-04-13 DIAGNOSIS — E1169 Type 2 diabetes mellitus with other specified complication: Secondary | ICD-10-CM | POA: Diagnosis not present

## 2021-04-13 DIAGNOSIS — I10 Essential (primary) hypertension: Secondary | ICD-10-CM | POA: Diagnosis not present

## 2021-04-14 DIAGNOSIS — H35033 Hypertensive retinopathy, bilateral: Secondary | ICD-10-CM | POA: Diagnosis not present

## 2021-04-24 ENCOUNTER — Other Ambulatory Visit (HOSPITAL_COMMUNITY): Payer: Self-pay | Admitting: Internal Medicine

## 2021-04-24 DIAGNOSIS — Z1231 Encounter for screening mammogram for malignant neoplasm of breast: Secondary | ICD-10-CM

## 2021-05-04 ENCOUNTER — Other Ambulatory Visit: Payer: Self-pay

## 2021-05-04 ENCOUNTER — Ambulatory Visit (HOSPITAL_COMMUNITY)
Admission: RE | Admit: 2021-05-04 | Discharge: 2021-05-04 | Disposition: A | Discharge status: Home / Self Care | Payer: Medicare Other | Source: Ambulatory Visit | Attending: Internal Medicine | Admitting: Internal Medicine

## 2021-05-04 DIAGNOSIS — Z1231 Encounter for screening mammogram for malignant neoplasm of breast: Secondary | ICD-10-CM | POA: Insufficient documentation

## 2021-07-20 DIAGNOSIS — E1169 Type 2 diabetes mellitus with other specified complication: Secondary | ICD-10-CM | POA: Diagnosis not present

## 2021-07-20 DIAGNOSIS — E785 Hyperlipidemia, unspecified: Secondary | ICD-10-CM | POA: Diagnosis not present

## 2021-07-20 DIAGNOSIS — I10 Essential (primary) hypertension: Secondary | ICD-10-CM | POA: Diagnosis not present

## 2021-10-21 DIAGNOSIS — E1169 Type 2 diabetes mellitus with other specified complication: Secondary | ICD-10-CM | POA: Diagnosis not present

## 2021-10-21 DIAGNOSIS — E785 Hyperlipidemia, unspecified: Secondary | ICD-10-CM | POA: Diagnosis not present

## 2021-10-21 DIAGNOSIS — I10 Essential (primary) hypertension: Secondary | ICD-10-CM | POA: Diagnosis not present

## 2021-10-21 DIAGNOSIS — Z0000 Encounter for general adult medical examination without abnormal findings: Secondary | ICD-10-CM | POA: Diagnosis not present

## 2021-11-02 DIAGNOSIS — H35033 Hypertensive retinopathy, bilateral: Secondary | ICD-10-CM | POA: Diagnosis not present

## 2021-11-05 DIAGNOSIS — H01001 Unspecified blepharitis right upper eyelid: Secondary | ICD-10-CM | POA: Diagnosis not present

## 2021-11-05 DIAGNOSIS — H11823 Conjunctivochalasis, bilateral: Secondary | ICD-10-CM | POA: Diagnosis not present

## 2021-11-05 DIAGNOSIS — H25812 Combined forms of age-related cataract, left eye: Secondary | ICD-10-CM | POA: Diagnosis not present

## 2022-01-21 DIAGNOSIS — Z0000 Encounter for general adult medical examination without abnormal findings: Secondary | ICD-10-CM | POA: Diagnosis not present

## 2022-01-21 DIAGNOSIS — E785 Hyperlipidemia, unspecified: Secondary | ICD-10-CM | POA: Diagnosis not present

## 2022-01-21 DIAGNOSIS — L84 Corns and callosities: Secondary | ICD-10-CM | POA: Diagnosis not present

## 2022-01-21 DIAGNOSIS — E1169 Type 2 diabetes mellitus with other specified complication: Secondary | ICD-10-CM | POA: Diagnosis not present

## 2022-01-21 DIAGNOSIS — I10 Essential (primary) hypertension: Secondary | ICD-10-CM | POA: Diagnosis not present

## 2022-05-03 DIAGNOSIS — E785 Hyperlipidemia, unspecified: Secondary | ICD-10-CM | POA: Diagnosis not present

## 2022-05-03 DIAGNOSIS — E1169 Type 2 diabetes mellitus with other specified complication: Secondary | ICD-10-CM | POA: Diagnosis not present

## 2022-05-03 DIAGNOSIS — Z0000 Encounter for general adult medical examination without abnormal findings: Secondary | ICD-10-CM | POA: Diagnosis not present

## 2022-05-03 DIAGNOSIS — I10 Essential (primary) hypertension: Secondary | ICD-10-CM | POA: Diagnosis not present

## 2022-06-14 ENCOUNTER — Other Ambulatory Visit (HOSPITAL_COMMUNITY): Payer: Self-pay | Admitting: Internal Medicine

## 2022-06-14 DIAGNOSIS — Z1231 Encounter for screening mammogram for malignant neoplasm of breast: Secondary | ICD-10-CM

## 2022-06-21 ENCOUNTER — Ambulatory Visit (HOSPITAL_COMMUNITY): Payer: Medicare Other

## 2022-06-30 ENCOUNTER — Ambulatory Visit (HOSPITAL_COMMUNITY)
Admission: RE | Admit: 2022-06-30 | Discharge: 2022-06-30 | Disposition: A | Discharge status: Home / Self Care | Payer: Medicare Other | Source: Ambulatory Visit | Attending: Internal Medicine | Admitting: Internal Medicine

## 2022-06-30 DIAGNOSIS — Z1231 Encounter for screening mammogram for malignant neoplasm of breast: Secondary | ICD-10-CM | POA: Insufficient documentation

## 2022-08-19 DIAGNOSIS — E1169 Type 2 diabetes mellitus with other specified complication: Secondary | ICD-10-CM | POA: Diagnosis not present

## 2022-08-19 DIAGNOSIS — E785 Hyperlipidemia, unspecified: Secondary | ICD-10-CM | POA: Diagnosis not present

## 2022-08-19 DIAGNOSIS — Z0000 Encounter for general adult medical examination without abnormal findings: Secondary | ICD-10-CM | POA: Diagnosis not present

## 2022-08-19 DIAGNOSIS — I10 Essential (primary) hypertension: Secondary | ICD-10-CM | POA: Diagnosis not present

## 2022-11-18 DIAGNOSIS — M810 Age-related osteoporosis without current pathological fracture: Secondary | ICD-10-CM | POA: Diagnosis not present

## 2022-11-25 DIAGNOSIS — E1169 Type 2 diabetes mellitus with other specified complication: Secondary | ICD-10-CM | POA: Diagnosis not present

## 2022-11-25 DIAGNOSIS — Z0000 Encounter for general adult medical examination without abnormal findings: Secondary | ICD-10-CM | POA: Diagnosis not present

## 2022-11-25 DIAGNOSIS — I10 Essential (primary) hypertension: Secondary | ICD-10-CM | POA: Diagnosis not present

## 2022-11-25 DIAGNOSIS — E785 Hyperlipidemia, unspecified: Secondary | ICD-10-CM | POA: Diagnosis not present

## 2023-02-24 DIAGNOSIS — E785 Hyperlipidemia, unspecified: Secondary | ICD-10-CM | POA: Diagnosis not present

## 2023-02-24 DIAGNOSIS — E1169 Type 2 diabetes mellitus with other specified complication: Secondary | ICD-10-CM | POA: Diagnosis not present

## 2023-02-24 DIAGNOSIS — Z0000 Encounter for general adult medical examination without abnormal findings: Secondary | ICD-10-CM | POA: Diagnosis not present

## 2023-02-24 DIAGNOSIS — I10 Essential (primary) hypertension: Secondary | ICD-10-CM | POA: Diagnosis not present

## 2023-02-24 DIAGNOSIS — Z6826 Body mass index (BMI) 26.0-26.9, adult: Secondary | ICD-10-CM | POA: Diagnosis not present

## 2023-05-26 ENCOUNTER — Other Ambulatory Visit (HOSPITAL_COMMUNITY): Payer: Self-pay | Admitting: Internal Medicine

## 2023-05-26 DIAGNOSIS — Z1231 Encounter for screening mammogram for malignant neoplasm of breast: Secondary | ICD-10-CM

## 2023-06-02 DIAGNOSIS — I10 Essential (primary) hypertension: Secondary | ICD-10-CM | POA: Diagnosis not present

## 2023-06-02 DIAGNOSIS — E785 Hyperlipidemia, unspecified: Secondary | ICD-10-CM | POA: Diagnosis not present

## 2023-06-02 DIAGNOSIS — Z6826 Body mass index (BMI) 26.0-26.9, adult: Secondary | ICD-10-CM | POA: Diagnosis not present

## 2023-06-02 DIAGNOSIS — E1121 Type 2 diabetes mellitus with diabetic nephropathy: Secondary | ICD-10-CM | POA: Diagnosis not present

## 2023-06-02 DIAGNOSIS — N182 Chronic kidney disease, stage 2 (mild): Secondary | ICD-10-CM | POA: Diagnosis not present

## 2023-06-07 DIAGNOSIS — B880 Other acariasis: Secondary | ICD-10-CM | POA: Diagnosis not present

## 2023-07-04 ENCOUNTER — Ambulatory Visit (HOSPITAL_COMMUNITY)
Admission: RE | Admit: 2023-07-04 | Discharge: 2023-07-04 | Disposition: A | Discharge status: Home / Self Care | Payer: Medicare HMO | Source: Ambulatory Visit | Attending: Internal Medicine | Admitting: Internal Medicine

## 2023-07-04 DIAGNOSIS — Z1231 Encounter for screening mammogram for malignant neoplasm of breast: Secondary | ICD-10-CM | POA: Diagnosis not present

## 2023-09-15 DIAGNOSIS — E1121 Type 2 diabetes mellitus with diabetic nephropathy: Secondary | ICD-10-CM | POA: Diagnosis not present

## 2023-09-15 DIAGNOSIS — E1122 Type 2 diabetes mellitus with diabetic chronic kidney disease: Secondary | ICD-10-CM | POA: Diagnosis not present

## 2023-09-15 DIAGNOSIS — N182 Chronic kidney disease, stage 2 (mild): Secondary | ICD-10-CM | POA: Diagnosis not present

## 2023-09-15 DIAGNOSIS — E785 Hyperlipidemia, unspecified: Secondary | ICD-10-CM | POA: Diagnosis not present

## 2023-09-15 DIAGNOSIS — I10 Essential (primary) hypertension: Secondary | ICD-10-CM | POA: Diagnosis not present

## 2023-10-13 DIAGNOSIS — H35033 Hypertensive retinopathy, bilateral: Secondary | ICD-10-CM | POA: Diagnosis not present

## 2023-11-14 DIAGNOSIS — H25812 Combined forms of age-related cataract, left eye: Secondary | ICD-10-CM | POA: Diagnosis not present

## 2023-12-15 DIAGNOSIS — I10 Essential (primary) hypertension: Secondary | ICD-10-CM | POA: Diagnosis not present

## 2023-12-15 DIAGNOSIS — E7849 Other hyperlipidemia: Secondary | ICD-10-CM | POA: Diagnosis not present

## 2023-12-15 DIAGNOSIS — M1991 Primary osteoarthritis, unspecified site: Secondary | ICD-10-CM | POA: Diagnosis not present

## 2023-12-15 DIAGNOSIS — E1122 Type 2 diabetes mellitus with diabetic chronic kidney disease: Secondary | ICD-10-CM | POA: Diagnosis not present

## 2023-12-15 DIAGNOSIS — N1831 Chronic kidney disease, stage 3a: Secondary | ICD-10-CM | POA: Diagnosis not present

## 2023-12-15 DIAGNOSIS — Z6824 Body mass index (BMI) 24.0-24.9, adult: Secondary | ICD-10-CM | POA: Diagnosis not present

## 2023-12-15 DIAGNOSIS — Z0000 Encounter for general adult medical examination without abnormal findings: Secondary | ICD-10-CM | POA: Diagnosis not present

## 2024-01-02 DIAGNOSIS — Z7689 Persons encountering health services in other specified circumstances: Secondary | ICD-10-CM | POA: Diagnosis not present

## 2024-01-02 DIAGNOSIS — H25812 Combined forms of age-related cataract, left eye: Secondary | ICD-10-CM | POA: Diagnosis not present

## 2024-01-02 DIAGNOSIS — I10 Essential (primary) hypertension: Secondary | ICD-10-CM | POA: Diagnosis not present

## 2024-01-03 NOTE — H&P (Signed)
 Surgical History & Physical  Patient Name: Jennifer Short  DOB: [birth year]  Surgery: Cataract extraction with intraocular lens implant phacoemulsification; Left Eye Surgeon: [name] MD Surgery Date: 01/09/2024 Pre-Op Date: 11/14/2023  HPI: A 61 Yr. old female patient 1. The patient complains of difficulty when reading/viewing blackboard. The left eye is affected. The episode is constant. The condition's severity increased since last visit. Near vision is worse than distance. The complaint is associated with blurry vision. The left eye is much worse than the right. This is negatively affecting the patient's quality of life and the patient is unable to function adequately in life with the current level of vision. HPI was performed by [name] .  Medical History: Cataracts Presbyopia Diabetes High Blood Pressure LDL  Review of Systems Cardiovascular High Blood Pressure Endocrine Diabetes  Social Never smoked  Medication Artifical Tears, Systane Complete,  Glipizide, Lovastatin, Benazepril, Amlodipine Besylate, Potassium chloride, Losartan, Potassium chloride, Lovastatin, Glipizide, Hydrochlorothiazide, Jardiance, Ozempic, Olmesartan  Sx/Procedures Phaco c IOL  Drug Allergies  NKDA  History & Physical: Heent: cataract NECK: supple without bruits LUNGS: lungs clear to auscultation CV: regular rate and rhythm Abdomen: soft and non-tender  Impression & Plan: Assessment: 1.  COMBINED FORMS AGE RELATED CATARACT; Left Eye (H25.812) 2.  CONJUNCTIVOCHALASIS; Both Eyes (H11.823) 3.  INTRAOCULAR LENS IOL ; Right Eye (Z96.1) 4.  BLEPHARITIS; Right Upper Lid, Right Lower Lid, Left Upper Lid, Left Lower Lid (H01.001, H01.002,H01.004,H01.005) 5.  Pinguecula; Both Eyes (H11.153) 6.  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  Cataract accounts for the patient's decreased vision. This visual impairment is not correctable with a tolerable change in glasses or contact  lenses. Cataract surgery with an implantation of a new lens should significantly improve the visual and functional status of the patient. Discussed all risks, benefits, alternatives, and potential complications. Discussed the procedures and recovery. Patient desires to have surgery. A-scan ordered and performed today for intra-ocular lens calculations. The surgery will be performed in order to improve vision for driving, reading, and for eye examinations. Recommend phacoemulsification with intra-ocular lens. Recommend Dextenza  for post-operative pain and inflammation. Left Eye. Surgery required to correct imbalance of vision. Dilates poorly - shugarcaine by protocol. Malyugin Ring. Omidira. Vision Blue in room - dense cortical.  2.  with concretions OD. Accounts for patient's symptoms OD. Pooling of tears. Patient defers any procedures at this time.  3.  Stable. Doing well since surgery  4.  Warm compresses 7-10 minutes every day, both eyes. Regular lid cleaning.  5.  Stable.  6.  Discussed significance of finding Answered patient questions about finding

## 2024-01-05 ENCOUNTER — Encounter (HOSPITAL_COMMUNITY): Payer: Self-pay

## 2024-01-05 ENCOUNTER — Encounter (HOSPITAL_COMMUNITY)
Admission: RE | Admit: 2024-01-05 | Discharge: 2024-01-05 | Disposition: A | Discharge status: Home / Self Care | Source: Ambulatory Visit | Attending: Ophthalmology | Admitting: Ophthalmology

## 2024-01-05 ENCOUNTER — Other Ambulatory Visit: Payer: Self-pay

## 2024-01-09 ENCOUNTER — Ambulatory Visit (HOSPITAL_COMMUNITY)
Admission: RE | Admit: 2024-01-09 | Discharge: 2024-01-09 | Disposition: A | Discharge status: Home / Self Care | Attending: Ophthalmology | Admitting: Ophthalmology

## 2024-01-09 ENCOUNTER — Ambulatory Visit (HOSPITAL_COMMUNITY): Admitting: Anesthesiology

## 2024-01-09 ENCOUNTER — Encounter (HOSPITAL_COMMUNITY)
Admission: RE | Disposition: A | Discharge status: Home / Self Care | Payer: Self-pay | Source: Home / Self Care | Attending: Ophthalmology

## 2024-01-09 ENCOUNTER — Encounter (HOSPITAL_COMMUNITY): Payer: Self-pay | Admitting: Ophthalmology

## 2024-01-09 DIAGNOSIS — H18411 Arcus senilis, right eye: Secondary | ICD-10-CM | POA: Diagnosis not present

## 2024-01-09 DIAGNOSIS — I10 Essential (primary) hypertension: Secondary | ICD-10-CM | POA: Diagnosis not present

## 2024-01-09 DIAGNOSIS — H25812 Combined forms of age-related cataract, left eye: Secondary | ICD-10-CM

## 2024-01-09 DIAGNOSIS — H01004 Unspecified blepharitis left upper eyelid: Secondary | ICD-10-CM | POA: Insufficient documentation

## 2024-01-09 DIAGNOSIS — H11153 Pinguecula, bilateral: Secondary | ICD-10-CM | POA: Diagnosis not present

## 2024-01-09 DIAGNOSIS — H5712 Ocular pain, left eye: Secondary | ICD-10-CM | POA: Diagnosis not present

## 2024-01-09 DIAGNOSIS — E1136 Type 2 diabetes mellitus with diabetic cataract: Secondary | ICD-10-CM | POA: Insufficient documentation

## 2024-01-09 DIAGNOSIS — E119 Type 2 diabetes mellitus without complications: Secondary | ICD-10-CM

## 2024-01-09 DIAGNOSIS — H01001 Unspecified blepharitis right upper eyelid: Secondary | ICD-10-CM | POA: Diagnosis not present

## 2024-01-09 DIAGNOSIS — H11823 Conjunctivochalasis, bilateral: Secondary | ICD-10-CM | POA: Diagnosis not present

## 2024-01-09 DIAGNOSIS — Z7984 Long term (current) use of oral hypoglycemic drugs: Secondary | ICD-10-CM | POA: Diagnosis not present

## 2024-01-09 DIAGNOSIS — H01005 Unspecified blepharitis left lower eyelid: Secondary | ICD-10-CM | POA: Insufficient documentation

## 2024-01-09 DIAGNOSIS — H01002 Unspecified blepharitis right lower eyelid: Secondary | ICD-10-CM | POA: Diagnosis not present

## 2024-01-09 DIAGNOSIS — H2512 Age-related nuclear cataract, left eye: Secondary | ICD-10-CM | POA: Diagnosis not present

## 2024-01-09 HISTORY — PX: INSERTION, STENT, DRUG-ELUTING, LACRIMAL CANALICULUS: SHX7453

## 2024-01-09 HISTORY — PX: CATARACT EXTRACTION W/PHACO: SHX586

## 2024-01-09 LAB — GLUCOSE, CAPILLARY: Glucose-Capillary: 144 mg/dL — ABNORMAL HIGH (ref 70–99)

## 2024-01-09 SURGERY — PHACOEMULSIFICATION, CATARACT, WITH IOL INSERTION
Anesthesia: Monitor Anesthesia Care | Site: Eye | Laterality: Left

## 2024-01-09 MED ORDER — LIDOCAINE HCL (PF) 1 % IJ SOLN
INTRAOCULAR | Status: DC | PRN
  Administered 2024-01-09: 1 mL via OPHTHALMIC

## 2024-01-09 MED ORDER — SODIUM HYALURONATE 10 MG/ML IO SOLUTION
PREFILLED_SYRINGE | INTRAOCULAR | Status: DC | PRN
  Administered 2024-01-09: .85 mL via INTRAOCULAR

## 2024-01-09 MED ORDER — MIDAZOLAM HCL 2 MG/2ML IJ SOLN
INTRAMUSCULAR | Status: AC
  Filled 2024-01-09: qty 2

## 2024-01-09 MED ORDER — TROPICAMIDE 1 % OP SOLN
1.0000 [drp] | OPHTHALMIC | Status: AC | PRN
  Administered 2024-01-09 (×3): 1 [drp] via OPHTHALMIC

## 2024-01-09 MED ORDER — DEXAMETHASONE 0.4 MG OP INST
VAGINAL_INSERT | OPHTHALMIC | Status: DC | PRN
  Administered 2024-01-09: .4 mg via OPHTHALMIC

## 2024-01-09 MED ORDER — POVIDONE-IODINE 5 % OP SOLN
OPHTHALMIC | Status: DC | PRN
  Administered 2024-01-09: 1 via OPHTHALMIC

## 2024-01-09 MED ORDER — EPINEPHRINE PF 1 MG/ML IJ SOLN
INTRAOCULAR | Status: DC | PRN
  Administered 2024-01-09: 500 mL

## 2024-01-09 MED ORDER — BSS IO SOLN
INTRAOCULAR | Status: DC | PRN
  Administered 2024-01-09: 15 mL via INTRAOCULAR

## 2024-01-09 MED ORDER — TETRACAINE HCL 0.5 % OP SOLN
1.0000 [drp] | OPHTHALMIC | Status: AC | PRN
  Administered 2024-01-09 (×3): 1 [drp] via OPHTHALMIC

## 2024-01-09 MED ORDER — PHENYLEPHRINE HCL 2.5 % OP SOLN
1.0000 [drp] | OPHTHALMIC | Status: AC | PRN
  Administered 2024-01-09 (×3): 1 [drp] via OPHTHALMIC

## 2024-01-09 MED ORDER — LIDOCAINE HCL 3.5 % OP GEL
1.0000 | Freq: Once | OPHTHALMIC | Status: AC
  Administered 2024-01-09: 1 via OPHTHALMIC

## 2024-01-09 MED ORDER — SODIUM CHLORIDE 0.9% FLUSH: 3.0000 mL | Freq: Two times a day (BID) | INTRAVENOUS | Status: DC

## 2024-01-09 MED ORDER — SODIUM CHLORIDE 0.9% FLUSH: 3.0000 mL | INTRAVENOUS | Status: DC | PRN

## 2024-01-09 MED ORDER — SODIUM HYALURONATE 23MG/ML IO SOSY
PREFILLED_SYRINGE | INTRAOCULAR | Status: DC | PRN
  Administered 2024-01-09: .6 mL via INTRAOCULAR

## 2024-01-09 MED ORDER — MOXIFLOXACIN HCL 5 MG/ML IO SOLN
INTRAOCULAR | Status: DC | PRN
  Administered 2024-01-09: .3 mL via INTRACAMERAL

## 2024-01-09 MED ORDER — STERILE WATER FOR IRRIGATION IR SOLN
Status: DC | PRN
  Administered 2024-01-09: 1

## 2024-01-09 MED ORDER — MIDAZOLAM HCL 5 MG/5ML IJ SOLN
INTRAMUSCULAR | Status: DC | PRN
  Administered 2024-01-09: 2 mg via INTRAVENOUS

## 2024-01-09 SURGICAL SUPPLY — 12 items
CATARACT SUITE SIGHTPATH (MISCELLANEOUS) ×1 IMPLANT
CLOTH BEACON ORANGE TIMEOUT ST (SAFETY) ×1 IMPLANT
EYE SHIELD UNIVERSAL CLEAR (GAUZE/BANDAGES/DRESSINGS) IMPLANT
FEE CATARACT SUITE SIGHTPATH (MISCELLANEOUS) ×1 IMPLANT
GLOVE BIOGEL PI IND STRL 7.0 (GLOVE) ×2 IMPLANT
LENS IOL TECNIS EYHANCE 22.0 (Intraocular Lens) IMPLANT
NDL HYPO 18GX1.5 BLUNT FILL (NEEDLE) ×1 IMPLANT
NEEDLE HYPO 18GX1.5 BLUNT FILL (NEEDLE) ×1 IMPLANT
PAD ARMBOARD POSITIONER FOAM (MISCELLANEOUS) ×1 IMPLANT
SYR TB 1ML LL NO SAFETY (SYRINGE) ×1 IMPLANT
TAPE SURG TRANSPORE 1 IN (GAUZE/BANDAGES/DRESSINGS) IMPLANT
WATER STERILE IRR 250ML POUR (IV SOLUTION) ×1 IMPLANT

## 2024-01-09 NOTE — Op Note (Signed)
 Date of procedure: 01/09/24  Pre-operative diagnosis: Visually significant age-related combined cataract, Left Eye (H25.812)  Post-operative diagnosis:  Visually significant age-related combined cataract, Left Eye (H25.812) 2.   Pain and inflammation following cataract surgery, Left Eye (H57.12)  Procedure:  Removal of cataract via phacoemulsification and insertion of intra-ocular lens [employer] DIB00 +22.0D into the capsular bag of the Left Eye 2. Placement of Dextenza  Implant, Left Lower Lid  Attending surgeon: Pleas Brill. Myron Stankovich, MD, MA  Anesthesia: MAC, Topical Akten  Complications: None  Estimated Blood Loss: <46mL (minimal)  Specimens: None  Implants: As above  Indications:  Visually significant age-related cataract, Left Eye  Procedure:  The patient was seen and identified in the pre-operative area. The operative eye was identified and dilated.  The operative eye was marked.  Topical anesthesia was administered to the operative eye.     The patient was then to the operative suite and placed in the supine position.  A timeout was performed confirming the patient, procedure to be performed, and all other relevant information.   The patient's face was prepped and draped in the usual fashion for intra-ocular surgery.  A lid speculum was placed into the operative eye and the surgical microscope moved into place and focused.  An inferotemporal paracentesis was created using a 20 gauge paracentesis blade.  Shugarcaine was injected into the anterior chamber.  Viscoelastic was injected into the anterior chamber.  A temporal clear-corneal main wound incision was created using a 2.40mm microkeratome.  A continuous curvilinear capsulorrhexis was initiated using an irrigating cystitome and completed using capsulorrhexis forceps.  Hydrodissection and hydrodeliniation were performed.  Viscoelastic was injected into the anterior chamber.  A phacoemulsification handpiece and a chopper as a  second instrument were used to remove the nucleus and epinucleus. The irrigation/aspiration handpiece was used to remove any remaining cortical material.   The capsular bag was reinflated with viscoelastic, checked, and found to be intact.  The intraocular lens was inserted into the capsular bag.  The irrigation/aspiration handpiece was used to remove any remaining viscoelastic.  The clear corneal wound and paracentesis wounds were then hydrated and checked with Weck-Cels to be watertight.  0.1mL of moxifloxacin was injected into the anterior chamber.  The lid-speculum was removed. The lower punctum was dilated. A Dextenza  implant was placed in the lower canaliculus without complication.   The drape was removed.  The patient's face was cleaned with a wet and dry 4x4.   A clear shield was taped over the eye. The patient was taken to the post-operative care unit in good condition, having tolerated the procedure well.  Post-Op Instructions: The patient will follow up at [facility] for a same day post-operative evaluation and will receive all other orders and instructions.

## 2024-01-09 NOTE — Transfer of Care (Signed)
 Immediate Anesthesia Transfer of Care Note  Patient: Jennifer Short  Procedure(s) Performed: PHACOEMULSIFICATION, CATARACT, WITH IOL INSERTION (Left: Eye) INSERTION, STENT, DRUG-ELUTING, LACRIMAL CANALICULUS (Left: Eye)  Patient Location: PACU  Anesthesia Type:MAC  Level of Consciousness: awake, alert , and oriented  Airway & Oxygen Therapy: Patient Spontanous Breathing  Post-op Assessment: Report given to RN and Post -op Vital signs reviewed and stable  Post vital signs: Reviewed and stable  Last Vitals:  Vitals Value Taken Time  BP    Temp    Pulse    Resp    SpO2      Last Pain:  Vitals:   01/09/24 0947  TempSrc: Oral  PainSc: 0-No pain         Complications: No notable events documented.

## 2024-01-09 NOTE — Anesthesia Postprocedure Evaluation (Signed)
 Anesthesia Post Note  Patient: Jennifer Short  Procedure(s) Performed: PHACOEMULSIFICATION, CATARACT, WITH IOL INSERTION (Left: Eye) INSERTION, STENT, DRUG-ELUTING, LACRIMAL CANALICULUS (Left: Eye)  Patient location during evaluation: PACU Anesthesia Type: MAC Level of consciousness: awake and alert Pain management: pain level controlled Vital Signs Assessment: post-procedure vital signs reviewed and stable Respiratory status: spontaneous breathing, nonlabored ventilation, respiratory function stable and patient connected to nasal cannula oxygen Cardiovascular status: stable and blood pressure returned to baseline Postop Assessment: no apparent nausea or vomiting Anesthetic complications: no   There were no known notable events for this encounter.   Last Vitals:  Vitals:   01/09/24 0947 01/09/24 1101  BP: (!) 162/62 137/61  Pulse: 77 78  Resp: 15 15  Temp: 36.8 C 37.2 C  SpO2: 98% 100%    Last Pain:  Vitals:   01/09/24 1101  TempSrc: Oral  PainSc: 0-No pain                 Carrick Rijos L Keyoni Lapinski

## 2024-01-09 NOTE — Interval H&P Note (Signed)
 History and Physical Interval Note:  01/09/2024 10:30 AM  Jennifer Short  has presented today for surgery, with the diagnosis of combined forms age related cataract, left eye.  The various methods of treatment have been discussed with the patient and family. After consideration of risks, benefits and other options for treatment, the patient has consented to  Procedure(s) with comments: PHACOEMULSIFICATION, CATARACT, WITH IOL INSERTION (Left) INSERTION, STENT, DRUG-ELUTING, LACRIMAL CANALICULUS (Left) - Patient has Medicaid, no dextenza  as a surgical intervention.  The patient's history has been reviewed, patient examined, no change in status, stable for surgery.  I have reviewed the patient's chart and labs.  Questions were answered to the patient's satisfaction.     [name]

## 2024-01-09 NOTE — Discharge Instructions (Addendum)
 Please discharge patient when stable, will follow up today with Dr. June Leap at the [facility] office immediately following discharge.  Leave shield in place until visit.  All paperwork with discharge instructions will be given at the office.  [facility] Address:  [address]  [place], [place] [address]

## 2024-01-09 NOTE — Anesthesia Preprocedure Evaluation (Signed)
 Anesthesia Evaluation  Patient identified by MRN, date of birth, ID band Patient awake    Reviewed: Allergy & Precautions, H&P , NPO status , Patient's Chart, lab work & pertinent test results  Airway Mallampati: III  TM Distance: >3 FB Neck ROM: full    Dental no notable dental hx. (+) Missing, Chipped, Poor Dentition   Pulmonary neg pulmonary ROS   Pulmonary exam normal breath sounds clear to auscultation       Cardiovascular Exercise Tolerance: Good hypertension, Normal cardiovascular exam Rhythm:regular Rate:Normal     Neuro/Psych negative neurological ROS  negative psych ROS   GI/Hepatic negative GI ROS, Neg liver ROS,,,  Endo/Other  diabetes, Type 2    Renal/GU negative Renal ROS  negative genitourinary   Musculoskeletal   Abdominal   Peds  Hematology negative hematology ROS (+)   Anesthesia Other Findings   Reproductive/Obstetrics negative OB ROS                             Anesthesia Physical Anesthesia Plan  ASA: 3  Anesthesia Plan: MAC   Post-op Pain Management: Minimal or no pain anticipated   Induction:   PONV Risk Score and Plan:   Airway Management Planned: Natural Airway and Nasal Cannula  Additional Equipment: None  Intra-op Plan:   Post-operative Plan:   Informed Consent: I have reviewed the patients History and Physical, chart, labs and discussed the procedure including the risks, benefits and alternatives for the proposed anesthesia with the patient or authorized representative who has indicated his/her understanding and acceptance.     Dental Advisory Given  Plan Discussed with: CRNA  Anesthesia Plan Comments:         Anesthesia Quick Evaluation

## 2024-01-10 ENCOUNTER — Encounter (HOSPITAL_COMMUNITY): Payer: Self-pay | Admitting: Ophthalmology

## 2024-05-28 ENCOUNTER — Other Ambulatory Visit (HOSPITAL_COMMUNITY): Payer: Self-pay | Admitting: Nurse Practitioner

## 2024-05-28 DIAGNOSIS — Z1231 Encounter for screening mammogram for malignant neoplasm of breast: Secondary | ICD-10-CM

## 2024-07-06 ENCOUNTER — Ambulatory Visit (HOSPITAL_COMMUNITY)
Admission: RE | Admit: 2024-07-06 | Discharge: 2024-07-06 | Disposition: A | Discharge status: Home / Self Care | Source: Ambulatory Visit | Attending: Nurse Practitioner | Admitting: Nurse Practitioner

## 2024-07-06 DIAGNOSIS — Z1231 Encounter for screening mammogram for malignant neoplasm of breast: Secondary | ICD-10-CM | POA: Insufficient documentation
